# Patient Record
Sex: Female | Born: 1990 | Race: Black or African American | Hispanic: No | Marital: Single | Smoking: Never smoker
Health system: Southern US, Community
[De-identification: ages and names within clinical notes are randomized; demographics above are authoritative.]

## PROBLEM LIST (undated history)

## (undated) ENCOUNTER — Inpatient Hospital Stay (HOSPITAL_COMMUNITY): Payer: Self-pay

## (undated) ENCOUNTER — Inpatient Hospital Stay (HOSPITAL_COMMUNITY): Payer: No Typology Code available for payment source

## (undated) DIAGNOSIS — I10 Essential (primary) hypertension: Secondary | ICD-10-CM

## (undated) DIAGNOSIS — Z789 Other specified health status: Secondary | ICD-10-CM

## (undated) HISTORY — PX: INDUCED ABORTION: SHX677

## (undated) HISTORY — PX: NO PAST SURGERIES: SHX2092

---

## 2008-11-05 ENCOUNTER — Other Ambulatory Visit: Payer: Self-pay | Admitting: Emergency Medicine

## 2008-11-06 ENCOUNTER — Inpatient Hospital Stay (HOSPITAL_COMMUNITY): Admission: AD | Admit: 2008-11-06 | Discharge: 2008-11-06 | Payer: Self-pay | Admitting: Obstetrics & Gynecology

## 2008-11-06 ENCOUNTER — Ambulatory Visit: Payer: Self-pay | Admitting: Physician Assistant

## 2008-11-08 ENCOUNTER — Inpatient Hospital Stay (HOSPITAL_COMMUNITY): Admission: AD | Admit: 2008-11-08 | Discharge: 2008-11-08 | Payer: Self-pay | Admitting: Obstetrics & Gynecology

## 2008-11-12 ENCOUNTER — Inpatient Hospital Stay (HOSPITAL_COMMUNITY): Admission: AD | Admit: 2008-11-12 | Discharge: 2008-11-12 | Payer: Self-pay | Admitting: Obstetrics and Gynecology

## 2008-11-12 ENCOUNTER — Ambulatory Visit: Payer: Self-pay | Admitting: Obstetrics and Gynecology

## 2010-07-13 LAB — WET PREP, GENITAL
Trich, Wet Prep: NONE SEEN
WBC, Wet Prep HPF POC: NONE SEEN
Yeast Wet Prep HPF POC: NONE SEEN

## 2010-07-13 LAB — CBC
HCT: 39.3 % (ref 36.0–46.0)
Hemoglobin: 13.5 g/dL (ref 12.0–15.0)
RDW: 12.7 % (ref 11.5–15.5)

## 2010-07-13 LAB — URINALYSIS, ROUTINE W REFLEX MICROSCOPIC
Glucose, UA: NEGATIVE mg/dL
Hgb urine dipstick: NEGATIVE
Protein, ur: NEGATIVE mg/dL
Specific Gravity, Urine: <1.005 — ABNORMAL LOW (ref 1.005–1.030)

## 2010-07-13 LAB — HCG, QUANTITATIVE, PREGNANCY
hCG, Beta Chain, Quant, S: 411 m[IU]/mL — ABNORMAL HIGH (ref <5)
hCG, Beta Chain, Quant, S: 72 m[IU]/mL — ABNORMAL HIGH (ref <5)

## 2010-07-13 LAB — ABO/RH: ABO/RH(D): O POS

## 2011-07-14 ENCOUNTER — Emergency Department (HOSPITAL_BASED_OUTPATIENT_CLINIC_OR_DEPARTMENT_OTHER)
Admission: EM | Admit: 2011-07-14 | Discharge: 2011-07-14 | Disposition: A | Discharge status: Home / Self Care | Payer: No Typology Code available for payment source | Attending: Emergency Medicine | Admitting: Emergency Medicine

## 2011-07-14 ENCOUNTER — Encounter (HOSPITAL_BASED_OUTPATIENT_CLINIC_OR_DEPARTMENT_OTHER): Payer: Self-pay

## 2011-07-14 DIAGNOSIS — Y92410 Unspecified street and highway as the place of occurrence of the external cause: Secondary | ICD-10-CM | POA: Insufficient documentation

## 2011-07-14 DIAGNOSIS — T148XXA Other injury of unspecified body region, initial encounter: Secondary | ICD-10-CM

## 2011-07-14 DIAGNOSIS — IMO0002 Reserved for concepts with insufficient information to code with codable children: Secondary | ICD-10-CM | POA: Insufficient documentation

## 2011-07-14 MED ORDER — DIAZEPAM 5 MG PO TABS
2.5000 mg | ORAL_TABLET | Freq: Once | ORAL | Status: AC
  Administered 2011-07-14: 2.5 mg via ORAL

## 2011-07-14 MED ORDER — DIAZEPAM 2 MG PO TABS
2.0000 mg | ORAL_TABLET | Freq: Once | ORAL | Status: DC
  Filled 2011-07-14: qty 1

## 2011-07-14 MED ORDER — IBUPROFEN 200 MG PO TABS: 800.0000 mg | ORAL_TABLET | Freq: Three times a day (TID) | ORAL | Status: AC

## 2011-07-14 MED ORDER — DIAZEPAM 5 MG PO TABS: 5.0000 mg | ORAL_TABLET | Freq: Every day | ORAL | Status: AC

## 2011-07-14 MED ORDER — DIAZEPAM 5 MG PO TABS
ORAL_TABLET | ORAL | Status: AC
  Filled 2011-07-14: qty 1
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
## 2012-11-04 ENCOUNTER — Encounter (HOSPITAL_BASED_OUTPATIENT_CLINIC_OR_DEPARTMENT_OTHER): Payer: Self-pay | Admitting: *Deleted

## 2012-11-04 ENCOUNTER — Emergency Department (HOSPITAL_BASED_OUTPATIENT_CLINIC_OR_DEPARTMENT_OTHER)
Admission: EM | Admit: 2012-11-04 | Discharge: 2012-11-04 | Disposition: A | Discharge status: Home / Self Care | Payer: Self-pay | Attending: Emergency Medicine | Admitting: Emergency Medicine

## 2012-11-04 DIAGNOSIS — R197 Diarrhea, unspecified: Secondary | ICD-10-CM | POA: Insufficient documentation

## 2012-11-04 DIAGNOSIS — R112 Nausea with vomiting, unspecified: Secondary | ICD-10-CM

## 2012-11-04 DIAGNOSIS — Z3202 Encounter for pregnancy test, result negative: Secondary | ICD-10-CM | POA: Insufficient documentation

## 2012-11-04 LAB — URINALYSIS, ROUTINE W REFLEX MICROSCOPIC
Bilirubin Urine: NEGATIVE
Glucose, UA: NEGATIVE mg/dL
Ketones, ur: NEGATIVE mg/dL
Nitrite: NEGATIVE
Protein, ur: 30 mg/dL — AB
Specific Gravity, Urine: 1.016 (ref 1.005–1.030)
Urobilinogen, UA: 1 mg/dL (ref 0.0–1.0)
pH: 8.5 — ABNORMAL HIGH (ref 5.0–8.0)

## 2012-11-04 LAB — PREGNANCY, URINE: Preg Test, Ur: NEGATIVE

## 2012-11-04 LAB — URINE MICROSCOPIC-ADD ON

## 2012-11-04 MED ORDER — PROMETHAZINE HCL 25 MG PO TABS: 25.0000 mg | ORAL_TABLET | Freq: Four times a day (QID) | ORAL | Status: DC | PRN

## 2012-11-04 NOTE — ED Provider Notes (Signed)
CSN: [id number]     Arrival date & time 11/04/12  1941 History     First MD Initiated Contact with Patient 11/04/12 2001     Chief Complaint  Patient presents with  . Abdominal Pain   (Consider location/radiation/quality/duration/timing/severity/associated sxs/prior Treatment) HPI Comments: Pt also had 1 loose stool at same time she threw up.  This occurred at work and was told to come to the ED to be checked.  She is in the food industry.  LMP was around July 4th.  She did not miss her menses  Patient is a 22 y.o. female presenting with abdominal pain and vomiting. The history is provided by the patient.  Abdominal Pain This is a new problem. The current episode started less than 1 hour ago. The problem occurs constantly. The problem has been resolved. Associated symptoms include abdominal pain. Pertinent negatives include no chest pain.  Emesis Severity:  Mild Duration:  1 hour Number of daily episodes:  1 How soon after eating does vomiting occur:  1 hour Progression:  Resolved Chronicity:  New Recent urination:  Normal Relieved by:  Nothing Worsened by:  Nothing tried Associated symptoms: abdominal pain and diarrhea   Associated symptoms: no chills   Risk factors: suspect food intake   Risk factors: no alcohol use, no diabetes, not pregnant now, no prior abdominal surgery, no sick contacts and no travel to endemic areas     History reviewed. No pertinent past medical history. History reviewed. No pertinent past surgical history. History reviewed. No pertinent family history. History  Substance Use Topics  . Smoking status: Never Smoker   . Smokeless tobacco: Not on file  . Alcohol Use: No   OB History   Grav Para Term Preterm Abortions TAB SAB Ect Mult Living                 Review of Systems  Constitutional: Negative for fever, chills and appetite change.  Cardiovascular: Negative for chest pain.  Gastrointestinal: Positive for nausea, vomiting, abdominal pain and  diarrhea. Negative for blood in stool and abdominal distention.  Genitourinary: Negative for dysuria, urgency and flank pain.  Musculoskeletal: Negative for back pain.  Neurological: Negative for light-headedness.  All other systems reviewed and are negative.    Allergies  Review of patient's allergies indicates no known allergies.  Home Medications   Current Outpatient Rx  Name  Route  Sig  Dispense  Refill  . medroxyPROGESTERone (DEPO-PROVERA) 150 MG/ML injection   Intramuscular   Inject 150 mg into the muscle every 3 (three) months.         . promethazine (PHENERGAN) 25 MG tablet   Oral   Take 1 tablet (25 mg total) by mouth every 6 (six) hours as needed for nausea.   20 tablet   0    BP 124/74  Pulse 70  Temp(Src) 98.4 F (36.9 C) (Oral)  Resp 20  Ht 5' (1.524 m)  Wt 170 lb (77.111 kg)  BMI 33.2 kg/m2  SpO2 99%  LMP 10/09/2012 Physical Exam  Nursing note and vitals reviewed. Constitutional: She is oriented to person, place, and time. She appears well-developed and well-nourished. No distress.  HENT:  Head: Normocephalic and atraumatic.  Eyes: EOM are normal.  Neck: Neck supple.  Cardiovascular: Normal rate and regular rhythm.   No murmur heard. Pulmonary/Chest: Effort normal. No respiratory distress. She has no wheezes. She has no rales.  Abdominal: Soft. She exhibits no distension. There is no tenderness. There is no  rebound and no guarding.  Neurological: She is alert and oriented to person, place, and time. Coordination normal.  Skin: Skin is warm and dry. She is not diaphoretic.  Psychiatric: She has a normal mood and affect.    ED Course   Procedures (including critical care time)  Labs Reviewed  URINALYSIS, ROUTINE W REFLEX MICROSCOPIC - Abnormal; Notable for the following:    APPearance TURBID (*)    pH 8.5 (*)    Hgb urine dipstick LARGE (*)    Protein, ur 30 (*)    Leukocytes, UA TRACE (*)    All other components within normal limits   URINE MICROSCOPIC-ADD ON - Abnormal; Notable for the following:    Squamous Epithelial / LPF FEW (*)    Bacteria, UA FEW (*)    All other components within normal limits  PREGNANCY, URINE   No results found. 1. Nausea vomiting and diarrhea     MDM  Pt likely with abd pain, cramps, now relieved after emesis and diarrhea times 1. I susspect since it occurred 1 hour after eating a sub, she had acute gastroenteritis from food intake.  Pt with sof abd now, no guard or rebound, able to tolerate pos'.  HCG is neg.  UA has some blood in it, but no dysuria or frequency, no flank pain to suggest stone.  Pt may be just about on her menses is most likely explanation.  Pt is ok going home, rest, Rx for nausea and work note.    Gavin Pound. Oletta Lamas, MD 11/04/12 2105

## 2012-11-04 NOTE — ED Notes (Signed)
Missed her menses. Nausea. States she feels weak.

## 2013-04-06 NOTE — L&D Delivery Note (Signed)
Patient was C/C/+3 and pushed for 60 minutes with epidural.    At crowning, pt began to have panic attacks and was having difficulty pushing.  I offered VE for assist, explaining the risks, benefits and alternatives.  Pt desired to proceed.  VE placed with 3 contractions with one pop off.  However, pt was unable to push and I was unable to get full face delivered. I allowed her to involuntarily push with contractions for 3 contractions. The head only partially delivered and it took two more minutes for the face to deliver with her pushing some. Once the head was delivered, the neck retracted and the anterior shoulder was stuck.  Gentle downward traction did not release shoulder and this was stopped. Time was called, help was called and multiple maneuvers were tried- McRoberts, suprapubic pressure and even knee-chest. Finally in supine the pt was able to push enough for the posterior arm to be delivered.  SVD  female infant, Apgars 2,8, weight P.  Cord gas was unable to be obtained.  Neo attended delivery and examined baby- both arms were moving well and without apparent trauma. The patient had a second degree mediolateral episiotomy repaired with 2-0 vicryl R. Fundus was firm. EBL was expected amount. Placenta was delivered intact. Vagina was clear.  Baby was vigorous and doing skin to skin with mother.  Mica Ramdass A

## 2013-07-11 ENCOUNTER — Encounter (HOSPITAL_COMMUNITY): Payer: Self-pay | Admitting: Emergency Medicine

## 2013-07-11 ENCOUNTER — Emergency Department (HOSPITAL_COMMUNITY)
Admission: EM | Admit: 2013-07-11 | Discharge: 2013-07-11 | Disposition: A | Discharge status: Home / Self Care | Payer: BC Managed Care – PPO | Attending: Emergency Medicine | Admitting: Emergency Medicine

## 2013-07-11 DIAGNOSIS — O9989 Other specified diseases and conditions complicating pregnancy, childbirth and the puerperium: Secondary | ICD-10-CM | POA: Diagnosis present

## 2013-07-11 DIAGNOSIS — R52 Pain, unspecified: Secondary | ICD-10-CM | POA: Diagnosis not present

## 2013-07-11 DIAGNOSIS — O98819 Other maternal infectious and parasitic diseases complicating pregnancy, unspecified trimester: Secondary | ICD-10-CM | POA: Insufficient documentation

## 2013-07-11 DIAGNOSIS — J020 Streptococcal pharyngitis: Secondary | ICD-10-CM | POA: Diagnosis not present

## 2013-07-11 LAB — RAPID STREP SCREEN (MED CTR MEBANE ONLY): STREPTOCOCCUS, GROUP A SCREEN (DIRECT): POSITIVE — AB

## 2013-07-11 MED ORDER — CROMOLYN SODIUM 5.2 MG/ACT NA AERS: 1.0000 | INHALATION_SPRAY | Freq: Four times a day (QID) | NASAL | Status: DC

## 2013-07-11 MED ORDER — ACETAMINOPHEN 500 MG PO TABS
1000.0000 mg | ORAL_TABLET | Freq: Once | ORAL | Status: AC
  Administered 2013-07-11: 1000 mg via ORAL
  Filled 2013-07-11: qty 2

## 2013-07-11 MED ORDER — PENICILLIN V POTASSIUM 500 MG PO TABS: 500.0000 mg | ORAL_TABLET | Freq: Four times a day (QID) | ORAL | Status: DC

## 2013-07-11 NOTE — ED Provider Notes (Signed)
CSN: [id number]     Arrival date & time 07/11/13  1836 History  This chart was scribed for non-physician practitioner, Junious SilkHannah Ephrem Carrick, PA-C, working with Juliet RudeNathan R. Rubin PayorPickering, MD by Smiley HousemanFallon Davis, ED Scribe. This patient was seen in room TR08C/TR08C and the patient's care was started at 7:45 PM.  Chief Complaint  Patient presents with  . Generalized Body Aches   HPI HPI Comments: Kelly [name] is a 23 y.o. pregnant female who presents to the Emergency Department complaining of constant generalized myalgias that started this morning.  Pt has associated sore throat, HA, cough, and nausea.  She reports she is unsure if she has been running a fever, but ED temperature is 99.38F.  Pt denies vaginal bleeding, vaginal discharge, and emesis.  Pt denies taking any medications PTA.  Pt denies any sick contacts.  She states she found out she was pregnant five days ago.  She states this is her third pregnancy, but she denies having any children.    History reviewed. No pertinent past medical history. History reviewed. No pertinent past surgical history. History reviewed. No pertinent family history. History  Substance Use Topics  . Smoking status: Never Smoker   . Smokeless tobacco: Not on file  . Alcohol Use: No   OB History   Grav Para Term Preterm Abortions TAB SAB Ect Mult Living                 Review of Systems  Constitutional: Negative for fever and chills.  HENT: Positive for sore throat. Negative for congestion, ear pain, rhinorrhea and trouble swallowing.   Respiratory: Negative for cough, shortness of breath and wheezing.   Cardiovascular: Negative for chest pain.  Gastrointestinal: Positive for nausea. Negative for vomiting, abdominal pain and diarrhea.  Genitourinary: Negative for vaginal bleeding and vaginal discharge.  Musculoskeletal: Positive for myalgias. Negative for back pain.  Neurological: Positive for headaches. Negative for dizziness, weakness and numbness.   Psychiatric/Behavioral: Negative for behavioral problems and confusion.  All other systems reviewed and are negative.    Allergies  Review of patient's allergies indicates no known allergies.  Home Medications   Current Outpatient Rx  Name  Route  Sig  Dispense  Refill  . medroxyPROGESTERone (DEPO-PROVERA) 150 MG/ML injection   Intramuscular   Inject 150 mg into the muscle every 3 (three) months.         . promethazine (PHENERGAN) 25 MG tablet   Oral   Take 1 tablet (25 mg total) by mouth every 6 (six) hours as needed for nausea.   20 tablet   0    Triage Vitals: BP 123/69  Pulse 93  Temp(Src) 99.5 F (37.5 C) (Oral)  Resp 18  Wt 176 lb 2 oz (79.89 kg)  SpO2 100%  LMP 06/09/2013  Physical Exam  Nursing note and vitals reviewed. Constitutional: She is oriented to person, place, and time. She appears well-developed and well-nourished. No distress.  HENT:  Head: Normocephalic and atraumatic.  Right Ear: Tympanic membrane, external ear and ear canal normal.  Left Ear: Tympanic membrane, external ear and ear canal normal.  Nose: Nose normal. No mucosal edema or rhinorrhea.  Mouth/Throat: Uvula is midline and mucous membranes are normal. No trismus in the jaw. No uvula swelling. Posterior oropharyngeal erythema present. No oropharyngeal exudate or posterior oropharyngeal edema.  Eyes: Conjunctivae and EOM are normal. Right eye exhibits no discharge. Left eye exhibits no discharge.  Neck: Normal range of motion. No thyromegaly present.  Cardiovascular: Normal rate,  regular rhythm and normal heart sounds.  Exam reveals no gallop and no friction rub.   No murmur heard. Pulmonary/Chest: Effort normal and breath sounds normal. No stridor. No respiratory distress. She has no wheezes. She has no rales.  Abdominal: Soft. She exhibits no distension.  Musculoskeletal: Normal range of motion.  Lymphadenopathy:    She has no cervical adenopathy.  Neurological: She is alert and  oriented to person, place, and time. She has normal strength.  Skin: Skin is warm and dry. No rash noted. She is not diaphoretic. No erythema.  Psychiatric: She has a normal mood and affect. Her behavior is normal. Judgment and thought content normal.    ED Course  Procedures (including critical care time) DIAGNOSTIC STUDIES: Oxygen Saturation is 100% on RA, normal by my interpretation.    COORDINATION OF CARE: 7:51 PM-Will order rapid strep screen.  Will order Tylenol.  Patient informed of current plan of treatment and evaluation and agrees with plan.    Results for orders placed during the hospital encounter of 07/11/13  RAPID STREP SCREEN      Result Value Ref Range   Streptococcus, Group A Screen (Direct) POSITIVE (*) NEGATIVE    MDM   Final diagnoses:  Strep pharyngitis   Pt with positive strep test. Will d/c with rx for PCN.  Pt appears mildly dehydrated, discussed importance of water rehydration. Presentation non concerning for PTA or infxn spread to soft tissue. No trismus or uvula deviation. Specific return precautions discussed. Pt able to drink water in ED without difficulty with intact air way. Recommended PCP follow up.    I personally performed the services described in this documentation, which was scribed in my presence. The recorded information has been reviewed and is accurate.       Mora Bellman, PA-C 07/11/13 2038

## 2013-07-11 NOTE — Discharge Instructions (Signed)
Strep Throat  Strep throat is an infection of the throat. It is caused by a germ. Strep throat spreads from person to person by coughing, sneezing, or close contact.  HOME CARE  · Rinse your mouth (gargle) with warm salt water (1 teaspoon salt in 1 cup of water). Do this 3 to 4 times per day or as needed for comfort.  · Family members with a sore throat or fever should see a doctor.  · Make sure everyone in your house washes their hands well.  · Do not share food, drinking cups, or personal items.  · Eat soft foods until your sore throat gets better.  · Drink enough water and fluids to keep your pee (urine) clear or pale yellow.  · Rest.  · Stay home from school, daycare, or work until you have taken medicine for 24 hours.  · Only take medicine as told by your doctor.  · Take your medicine as told. Finish it even if you start to feel better.  GET HELP RIGHT AWAY IF:   · You have new problems, such as throwing up (vomiting) or bad headaches.  · You have a stiff or painful neck, chest pain, trouble breathing, or trouble swallowing.  · You have very bad throat pain, drooling, or changes in your voice.  · Your neck puffs up (swells) or gets red and tender.  · You have a fever.  · You are very tired, your mouth is dry, or you are peeing less than normal.  · You cannot wake up completely.  · You get a rash, cough, or earache.  · You have green, yellow-brown, or bloody spit.  · Your pain does not get better with medicine.  MAKE SURE YOU:   · Understand these instructions.  · Will watch your condition.  · Will get help right away if you are not doing well or get worse.  Document Released: 09/09/2007 Document Revised: 06/15/2011 Document Reviewed: 05/22/2010  ExitCare® Patient Information ©2014 [employer].

## 2013-07-11 NOTE — ED Notes (Signed)
Onset this morning upon awakening headache, body aches, sore throat.  Painful to swallow.  No swallowing or resp difficulties.  No known contact with anyone with strep.  Has not taken any meds for headache.  Pt has first OB appt 07-26-13.  LMP 06-09-13.

## 2013-07-11 NOTE — ED Notes (Signed)
Reports onset this am of generalized bodyaches, headache, sore throat. Denies any n/v/d. Pt is pregnant, lmp 3/6.

## 2013-07-12 NOTE — ED Provider Notes (Signed)
Medical screening examination/treatment/procedure(s) were performed by non-physician practitioner and as supervising physician I was immediately available for consultation/collaboration.   EKG Interpretation None       Michaelpaul Apo R. Sayid Moll, MD 07/12/13 2344 

## 2013-07-17 ENCOUNTER — Inpatient Hospital Stay (HOSPITAL_COMMUNITY): Payer: BC Managed Care – PPO

## 2013-07-17 ENCOUNTER — Encounter (HOSPITAL_COMMUNITY): Payer: Self-pay | Admitting: *Deleted

## 2013-07-17 ENCOUNTER — Inpatient Hospital Stay (HOSPITAL_COMMUNITY)
Admission: AD | Admit: 2013-07-17 | Discharge: 2013-07-17 | Disposition: A | Discharge status: Home / Self Care | Payer: BC Managed Care – PPO | Source: Ambulatory Visit | Attending: Obstetrics & Gynecology | Admitting: Obstetrics & Gynecology

## 2013-07-17 DIAGNOSIS — O209 Hemorrhage in early pregnancy, unspecified: Secondary | ICD-10-CM | POA: Diagnosis present

## 2013-07-17 DIAGNOSIS — O200 Threatened abortion: Secondary | ICD-10-CM

## 2013-07-17 DIAGNOSIS — O4690 Antepartum hemorrhage, unspecified, unspecified trimester: Secondary | ICD-10-CM

## 2013-07-17 HISTORY — DX: Other specified health status: Z78.9

## 2013-07-17 LAB — WET PREP, GENITAL
CLUE CELLS WET PREP: NONE SEEN
TRICH WET PREP: NONE SEEN
WBC, Wet Prep HPF POC: NONE SEEN
Yeast Wet Prep HPF POC: NONE SEEN

## 2013-07-17 LAB — CBC
HCT: 38.9 % (ref 36.0–46.0)
HEMOGLOBIN: 14 g/dL (ref 12.0–15.0)
MCH: 29.7 pg (ref 26.0–34.0)
MCHC: 36 g/dL (ref 30.0–36.0)
MCV: 82.4 fL (ref 78.0–100.0)
Platelets: 281 10*3/uL (ref 150–400)
RBC: 4.72 MIL/uL (ref 3.87–5.11)
RDW: 12.7 % (ref 11.5–15.5)
WBC: 4.6 10*3/uL (ref 4.0–10.5)

## 2013-07-17 LAB — URINALYSIS, ROUTINE W REFLEX MICROSCOPIC
Bilirubin Urine: NEGATIVE
Glucose, UA: NEGATIVE mg/dL
Ketones, ur: NEGATIVE mg/dL
Leukocytes, UA: NEGATIVE
Nitrite: NEGATIVE
PROTEIN: NEGATIVE mg/dL
SPECIFIC GRAVITY, URINE: 1.025 (ref 1.005–1.030)
UROBILINOGEN UA: 0.2 mg/dL (ref 0.0–1.0)
pH: 6 (ref 5.0–8.0)

## 2013-07-17 LAB — POCT PREGNANCY, URINE: Preg Test, Ur: POSITIVE — AB

## 2013-07-17 LAB — HCG, QUANTITATIVE, PREGNANCY: HCG, BETA CHAIN, QUANT, S: 2273 m[IU]/mL — AB (ref <5)

## 2013-07-17 LAB — URINE MICROSCOPIC-ADD ON

## 2013-07-17 LAB — OB RESULTS CONSOLE GC/CHLAMYDIA
CHLAMYDIA, DNA PROBE: NEGATIVE
Gonorrhea: NEGATIVE

## 2013-07-17 NOTE — MAU Provider Note (Signed)
Attestation of Attending Supervision of Advanced Practitioner (CNM/NP): Evaluation and management procedures were performed by the Advanced Practitioner under my supervision and collaboration.  I have reviewed the Advanced Practitioner's note and chart, and I agree with the management and plan.  Dallen Bunte Harraway-Smith 3:17 PM     

## 2013-07-17 NOTE — Discharge Instructions (Signed)
Pelvic Rest Pelvic rest is sometimes recommended for women when:   The placenta is partially or completely covering the opening of the cervix (placenta previa).  There is bleeding between the uterine wall and the amniotic sac in the first trimester (subchorionic hemorrhage).  The cervix begins to open without labor starting (incompetent cervix, cervical insufficiency).  The labor is too early (preterm labor). HOME CARE INSTRUCTIONS  Do not have sexual intercourse, stimulation, or an orgasm.  Do not use tampons, douche, or put anything in the vagina.  Do not lift anything over 10 pounds (4.5 kg).  Avoid strenuous activity or straining your pelvic muscles. SEEK MEDICAL CARE IF:  You have any vaginal bleeding during pregnancy. Treat this as a potential emergency.  You have cramping pain felt low in the stomach (stronger than menstrual cramps).  You notice vaginal discharge (watery, mucus, or bloody).  You have a low, dull backache.  There are regular contractions or uterine tightening. SEEK IMMEDIATE MEDICAL CARE IF: You have vaginal bleeding and have placenta previa.  Document Released: 07/18/2010 Document Revised: 06/15/2011 Document Reviewed: 07/18/2010 [facility] Patient Information 2014 [place], [place].  Threatened Miscarriage  A threatened miscarriage is a pregnancy that may end. It may be marked by bleeding during the first 20 weeks of pregnancy. Often, the pregnancy can continue without any more problems. You may be asked to stop:  Having sex (intercourse).  Having orgasms.  Using tampons.  Exercising.  Doing heavy physical activity and work. HOME CARE   Your doctor may tell you to take bed rest and to stop activities and work.  Write down the number of pads you use each day. Write down how often you change pads. Write down how soaked they are.  Follow your doctor's advice for follow-up visits and tests.  If your blood type is Rh-negative and the father's  blood is Rh-positive (or is not known), you may get a shot to protect the baby.  If you have a miscarriage, save all the tissue you pass in a container. Take the container to your doctor. GET HELP RIGHT AWAY IF:   You have bad cramps or pain in your belly (abdomen), lower belly, or back.  You have a fever or chills.  Your bleeding gets worse or you pass large clots of blood or tissue. Save this tissue to show your doctor.  You feel lightheaded, weak, dizzy, or pass out (faint).  You have a gush of fluid from your vagina. MAKE SURE YOU:   Understand these instructions.  Will watch your condition.  Will get help right away if you are not doing well or get worse. Document Released: 03/05/2008 Document Revised: 06/15/2011 Document Reviewed: 04/08/2009 [facility] Patient Information 2014 [place], [place].  Vaginal Bleeding During Pregnancy, First Trimester A small amount of bleeding (spotting) from the vagina is relatively common in early pregnancy. It usually stops on its own. Various things may cause bleeding or spotting in early pregnancy. Some bleeding may be related to the pregnancy, and some may not. In most cases, the bleeding is normal and is not a problem. However, bleeding can also be a sign of something serious. Be sure to tell your health care provider about any vaginal bleeding right away. Some possible causes of vaginal bleeding during the first trimester include:  Infection or inflammation of the cervix.  Growths (polyps) on the cervix.  Miscarriage or threatened miscarriage.  Pregnancy tissue has developed outside of the uterus and in a fallopian tube (tubal pregnancy).  Tiny  cysts have developed in the uterus instead of pregnancy tissue (molar pregnancy). HOME CARE INSTRUCTIONS  Watch your condition for any changes. The following actions may help to lessen any discomfort you are feeling:  Follow your health care provider's instructions for limiting your activity. If  your health care provider orders bed rest, you may need to stay in bed and only get up to use the bathroom. However, your health care provider may allow you to continue light activity.  If needed, make plans for someone to help with your regular activities and responsibilities while you are on bed rest.  Keep track of the number of pads you use each day, how often you change pads, and how soaked (saturated) they are. Write this down.  Do not use tampons. Do not douche.  Do not have sexual intercourse or orgasms until approved by your health care provider.  If you pass any tissue from your vagina, save the tissue so you can show it to your health care provider.  Only take over-the-counter or prescription medicines as directed by your health care provider.  Do not take aspirin because it can make you bleed.  Keep all follow-up appointments as directed by your health care provider. SEEK MEDICAL CARE IF:  You have any vaginal bleeding during any part of your pregnancy.  You have cramps or labor pains. SEEK IMMEDIATE MEDICAL CARE IF:   You have severe cramps in your back or belly (abdomen).  You have a fever, not controlled by medicine.  You pass large clots or tissue from your vagina.  Your bleeding increases.  You feel lightheaded or weak, or you have fainting episodes.  You have chills.  You are leaking fluid or have a gush of fluid from your vagina.  You pass out while having a bowel movement. MAKE SURE YOU:  Understand these instructions.  Will watch your condition.  Will get help right away if you are not doing well or get worse. Document Released: 12/31/2004 Document Revised: 01/11/2013 Document Reviewed: 11/28/2012 [facility] [place]ExitCare Patient Information 2014 [place]Care, [place].

## 2013-07-17 NOTE — MAU Note (Addendum)
Bleeding noted this morning one time when she wiped, red, small clot noted. Denies pain. + HPT the 5 or 6 of April

## 2013-07-17 NOTE — MAU Provider Note (Signed)
History     CSN: [id number]  Arrival date and time: 07/17/13 0906   First Provider Initiated Contact with Patient 07/17/13 [id number]      Chief Complaint  Patient presents with  . Possible Pregnancy  . Vaginal Bleeding   HPI   Ms. Kelly [name] is a 23 y.o. female [id number] Unknown gestation who presents following an episode of bright red bleeding that she noticed when she wiped this morning after using the bathroom. The patient had a positive home pregnancy test this week. The patient presents with a peri pad on that showed a small amount of bright red blood.  Patient denies pain.   OB History   Grav Para Term Preterm Abortions TAB SAB Ect Mult Living   3 0 0 0 2 1 1 0 0 0       No past medical history on file.  No past surgical history on file.  No family history on file.  History  Substance Use Topics  . Smoking status: Never Smoker   . Smokeless tobacco: Not on file  . Alcohol Use: No    Allergies: No Known Allergies  Prescriptions prior to admission  Medication Sig Dispense Refill  . cromolyn (NASALCROM) 5.2 MG/ACT nasal spray Place 1 spray into both nostrils 4 (four) times daily.  26 mL  12  . medroxyPROGESTERone (DEPO-PROVERA) 150 MG/ML injection Inject 150 mg into the muscle every 3 (three) months.      . penicillin v potassium (VEETID) 500 MG tablet Take 1 tablet (500 mg total) by mouth 4 (four) times daily.  40 tablet  0  . promethazine (PHENERGAN) 25 MG tablet Take 1 tablet (25 mg total) by mouth every 6 (six) hours as needed for nausea.  20 tablet  0   Results for orders placed during the hospital encounter of 07/17/13 (from the past 48 hour(s))  URINALYSIS, ROUTINE W REFLEX MICROSCOPIC     Status: Abnormal   Collection Time    07/17/13  9:20 AM      Result Value Ref Range   Color, Urine YELLOW  YELLOW   APPearance CLEAR  CLEAR   Specific Gravity, Urine 1.025  1.005 - 1.030   pH 6.0  5.0 - 8.0   Glucose, UA NEGATIVE  NEGATIVE mg/dL   Hgb  urine dipstick LARGE (*) NEGATIVE   Bilirubin Urine NEGATIVE  NEGATIVE   Ketones, ur NEGATIVE  NEGATIVE mg/dL   Protein, ur NEGATIVE  NEGATIVE mg/dL   Urobilinogen, UA 0.2  0.0 - 1.0 mg/dL   Nitrite NEGATIVE  NEGATIVE   Leukocytes, UA NEGATIVE  NEGATIVE  URINE MICROSCOPIC-ADD ON     Status: Abnormal   Collection Time    07/17/13  9:20 AM      Result Value Ref Range   Squamous Epithelial / LPF FEW (*) RARE   WBC, UA 0-2  <3 WBC/hpf   RBC / HPF 0-2  <3 RBC/hpf  POCT PREGNANCY, URINE     Status: Abnormal   Collection Time    07/17/13  9:38 AM      Result Value Ref Range   Preg Test, Ur POSITIVE (*) NEGATIVE   Comment:            THE SENSITIVITY OF THIS     METHODOLOGY IS >24 mIU/mL  WET PREP, GENITAL     Status: None   Collection Time    07/17/13  9:48 AM      Result Value Ref Range   Yeast  Wet Prep HPF POC NONE SEEN  NONE SEEN   Trich, Wet Prep NONE SEEN  NONE SEEN   Clue Cells Wet Prep HPF POC NONE SEEN  NONE SEEN   WBC, Wet Prep HPF POC NONE SEEN  NONE SEEN   Comment: MODERATE BACTERIA SEEN  CBC     Status: None   Collection Time    07/17/13  9:49 AM      Result Value Ref Range   WBC 4.6  4.0 - 10.5 K/uL   RBC 4.72  3.87 - 5.11 MIL/uL   Hemoglobin 14.0  12.0 - 15.0 g/dL   HCT 96.038.9  45.436.0 - 09.846.0 %   MCV 82.4  78.0 - 100.0 fL   MCH 29.7  26.0 - 34.0 pg   MCHC 36.0  30.0 - 36.0 g/dL   RDW 11.912.7  14.711.5 - 82.915.5 %   Platelets 281  150 - 400 K/uL  HCG, QUANTITATIVE, PREGNANCY     Status: Abnormal   Collection Time    07/17/13  9:49 AM      Result Value Ref Range   hCG, Beta Chain, Quant, S 2273 (*) <5 mIU/mL   Comment:              GEST. AGE      CONC.  (mIU/mL)       <=1 WEEK        5 - 50         2 WEEKS       50 - 500         3 WEEKS       100 - 10,000         4 WEEKS     1,000 - 30,000         5 WEEKS     3,500 - 115,000       6-8 WEEKS     12,000 - 270,000        12 WEEKS     15,000 - 220,000                FEMALE AND NON-PREGNANT FEMALE:         LESS THAN 5 mIU/mL    [place] Ob Comp Less 14 Wks  07/17/2013   CLINICAL DATA:  23 year old female with bleeding in early pregnancy. Estimated gestational age by LMP 5 weeks 3 days. Initial encounter.  EXAM: OBSTETRIC <14 WK US AND TRANSVAGINAL OB US  TECHNIQUE: Both transabdominal and transvaginal ultrasound examinations were performed for complete evaluation of the gestation as well as the maternal uterus, adnexal regions, and pelvic cul-de-sac. Transvaginal technique was performed to assess early pregnancy.  COMPARISON:  None relevant.  FINDINGS: Intrauterine gestational sac: Single  Yolk sac:  Not visible  Embryo:  Not visible  Cardiac Activity: Not detected  MSD:  4.1  mm   for w   6  d  Maternal uterus/adnexae: Small volume subchorionic hemorrhage suspected (image 57). Both ovaries appear normal with small follicles. Trace simple appearing pelvic free fluid.  IMPRESSION: 1. Probable early intrauterine gestational sac, but no yolk sac, fetal pole, or cardiac activity yet visualized. Recommend follow-up quantitative B-HCG levels and follow-up US in 14 days to confirm and assess viability. This recommendation follows SRU consensus guidelines: Diagnostic Criteria for Nonviable Pregnancy Early in the First Trimester. Malva Limes Engl J Med 2013; 562:1308-65; 369:1443-51. 2. Small volume subchorionic hemorrhage. Trace simple appearing pelvic free fluid.   Electronically Signed   By: Augusto GambleLee  Hall  M.D.   On: 07/17/2013 10:48   [place] Ob Transvaginal  07/17/2013   CLINICAL DATA:  23 year old female with bleeding in early pregnancy. Estimated gestational age by LMP 5 weeks 3 days. Initial encounter.  EXAM: OBSTETRIC <14 WK US AND TRANSVAGINAL OB US  TECHNIQUE: Both transabdominal and transvaginal ultrasound examinations were performed for complete evaluation of the gestation as well as the maternal uterus, adnexal regions, and pelvic cul-de-sac. Transvaginal technique was performed to assess early pregnancy.  COMPARISON:  None relevant.  FINDINGS: Intrauterine  gestational sac: Single  Yolk sac:  Not visible  Embryo:  Not visible  Cardiac Activity: Not detected  MSD:  4.1  mm   for w   6  d  Maternal uterus/adnexae: Small volume subchorionic hemorrhage suspected (image 57). Both ovaries appear normal with small follicles. Trace simple appearing pelvic free fluid.  IMPRESSION: 1. Probable early intrauterine gestational sac, but no yolk sac, fetal pole, or cardiac activity yet visualized. Recommend follow-up quantitative B-HCG levels and follow-up US in 14 days to confirm and assess viability. This recommendation follows SRU consensus guidelines: Diagnostic Criteria for Nonviable Pregnancy Early in the First Trimester. Malva Limes Engl J Med 2013; 409:8119-14; 369:1443-51. 2. Small volume subchorionic hemorrhage. Trace simple appearing pelvic free fluid.   Electronically Signed   By: Augusto GambleLee  Hall M.D.   On: 07/17/2013 10:48    Review of Systems  Constitutional: Negative for fever and chills.  Gastrointestinal: Negative for nausea, vomiting and abdominal pain.  Genitourinary: Negative for dysuria, urgency, frequency and hematuria.       No vaginal discharge. Scant vaginal bleeding. No dysuria.    Physical Exam   Blood pressure 118/79, pulse 85, temperature 98.9 F (37.2 C), temperature source Oral, resp. rate 16, height 4' 11.25" (1.505 m), weight 78.577 kg (173 lb 3.7 oz), last menstrual period 06/09/2013, SpO2 99.00%.  Physical Exam  Constitutional: She is oriented to person, place, and time. She appears well-developed and well-nourished. No distress.  HENT:  Head: Normocephalic.  Eyes: Pupils are equal, round, and reactive to light.  Neck: Neck supple.  GI: Soft. She exhibits no distension. There is no tenderness. There is no rebound and no guarding.  Genitourinary:  Speculum exam: Vagina - Small amount of creamy discharge, streaky red, mucus like blood- scant amount,  no odor Cervix - No contact bleeding, no active bleeding  Bimanual exam: Cervix closed, No CMT   Uterus non tender, normal size; enlarged  Adnexa non tender, no masses bilaterally GC/Chlam, wet prep done Chaperone present for exam.   Musculoskeletal: Normal range of motion.  Neurological: She is alert and oriented to person, place, and time.  Skin: Skin is warm. She is not diaphoretic.  Psychiatric: Her behavior is normal.    MAU Course  Procedures None  MDM Wet prep GC US CBC  Spoke to Dr. Margo AyeHall (radiology) pt is 317w6d by US; addendum requested  O positive blood type   Assessment and Plan   A:  Probable IUP; no yolk sac 117w6d Vaginal bleeding in early pregnancy, cannot exclude ectopic pregnancy  Threatened miscarriage  P:  Discharge home in stable condition Return in 48 hours- clinic for repeat beta hcg level.  Ectopic precautions discussed Return to MAU with worsening pain or bleeding  Bleeding precautions discussed   Iona HansenJennifer Irene Taylor Levick 07/17/2013, 2:28 PM

## 2013-07-17 NOTE — MAU Note (Signed)
Patient states she had a positive home pregnancy test about 2 weeks ago. States she woke up this am with bright red blood on the tissue. No active bleeding after wiping. Denies pain.

## 2013-07-18 LAB — GC/CHLAMYDIA PROBE AMP
CT Probe RNA: NEGATIVE
GC Probe RNA: NEGATIVE

## 2013-07-19 ENCOUNTER — Other Ambulatory Visit: Payer: BC Managed Care – PPO

## 2013-07-19 ENCOUNTER — Telehealth: Payer: Self-pay

## 2013-07-19 DIAGNOSIS — O3680X0 Pregnancy with inconclusive fetal viability, not applicable or unspecified: Secondary | ICD-10-CM

## 2013-07-19 DIAGNOSIS — E349 Endocrine disorder, unspecified: Secondary | ICD-10-CM

## 2013-07-19 LAB — HCG, QUANTITATIVE, PREGNANCY: hCG, Beta Chain, Quant, S: 5101.3 m[IU]/mL

## 2013-07-19 NOTE — Telephone Encounter (Signed)
Stat beta quant called in result: 5101.3. Chart reviewed by Dr. Erin FullingHarraway-Smith who reccomends ultrasound for viability within the next week. Called pt. Who denies any pain, bleeding or discomfort. States she is feeling fine. Informed pt. Of results and of ultrasound for 07/25/13 at 0845. Informed pt. That she should return to MAU for any severe pain or bleeding. Pt. Verbalized understanding. NO questions or concerns.

## 2013-07-25 ENCOUNTER — Ambulatory Visit (HOSPITAL_COMMUNITY)
Admission: RE | Admit: 2013-07-25 | Discharge: 2013-07-25 | Disposition: A | Discharge status: Home / Self Care | Payer: BC Managed Care – PPO | Source: Ambulatory Visit | Attending: Obstetrics & Gynecology | Admitting: Obstetrics & Gynecology

## 2013-07-25 DIAGNOSIS — O3680X0 Pregnancy with inconclusive fetal viability, not applicable or unspecified: Secondary | ICD-10-CM | POA: Diagnosis not present

## 2013-07-25 DIAGNOSIS — Z3689 Encounter for other specified antenatal screening: Secondary | ICD-10-CM | POA: Diagnosis present

## 2013-08-30 ENCOUNTER — Inpatient Hospital Stay (HOSPITAL_COMMUNITY)
Admission: AD | Admit: 2013-08-30 | Discharge: 2013-08-30 | Disposition: A | Discharge status: Home / Self Care | Payer: BC Managed Care – PPO | Source: Ambulatory Visit | Attending: Obstetrics and Gynecology | Admitting: Obstetrics and Gynecology

## 2013-08-30 ENCOUNTER — Encounter (HOSPITAL_COMMUNITY): Payer: Self-pay | Admitting: *Deleted

## 2013-08-30 DIAGNOSIS — O219 Vomiting of pregnancy, unspecified: Secondary | ICD-10-CM

## 2013-08-30 DIAGNOSIS — E860 Dehydration: Secondary | ICD-10-CM | POA: Diagnosis not present

## 2013-08-30 DIAGNOSIS — O211 Hyperemesis gravidarum with metabolic disturbance: Secondary | ICD-10-CM | POA: Diagnosis not present

## 2013-08-30 DIAGNOSIS — O210 Mild hyperemesis gravidarum: Secondary | ICD-10-CM | POA: Diagnosis present

## 2013-08-30 LAB — URINALYSIS, ROUTINE W REFLEX MICROSCOPIC
BILIRUBIN URINE: NEGATIVE
Glucose, UA: NEGATIVE mg/dL
Hgb urine dipstick: NEGATIVE
LEUKOCYTES UA: NEGATIVE
Nitrite: NEGATIVE
PH: 6 (ref 5.0–8.0)
Protein, ur: NEGATIVE mg/dL
Urobilinogen, UA: 0.2 mg/dL (ref 0.0–1.0)

## 2013-08-30 LAB — OB RESULTS CONSOLE RPR: RPR: NONREACTIVE

## 2013-08-30 LAB — OB RESULTS CONSOLE ABO/RH: RH TYPE: POSITIVE

## 2013-08-30 LAB — OB RESULTS CONSOLE HIV ANTIBODY (ROUTINE TESTING): HIV: NONREACTIVE

## 2013-08-30 LAB — OB RESULTS CONSOLE ANTIBODY SCREEN: ANTIBODY SCREEN: NEGATIVE

## 2013-08-30 LAB — OB RESULTS CONSOLE HEPATITIS B SURFACE ANTIGEN: HEP B S AG: NEGATIVE

## 2013-08-30 LAB — OB RESULTS CONSOLE RUBELLA ANTIBODY, IGM: Rubella: IMMUNE

## 2013-08-30 MED ORDER — SODIUM CHLORIDE 0.9 % IV SOLN
25.0000 mg | Freq: Once | INTRAVENOUS | Status: AC
  Administered 2013-08-30: 25 mg via INTRAVENOUS
  Filled 2013-08-30: qty 1

## 2013-08-30 MED ORDER — ONDANSETRON 4 MG PO TBDP: 4.0000 mg | ORAL_TABLET | Freq: Four times a day (QID) | ORAL | Status: DC | PRN

## 2013-08-30 NOTE — MAU Note (Signed)
Patient states she has had vomiting with this pregnancy for a while. Was every other day but has been daily for about 2 weeks. Not able to keep anything down. Was seen at the office today and sent to MAU for hydration. Denies bleeding or discharge. States she has occasional cramping but not today.

## 2013-08-30 NOTE — MAU Note (Signed)
PO SPRITE 

## 2013-08-30 NOTE — Discharge Instructions (Signed)
Morning Sickness °Morning sickness is when you feel sick to your stomach (nauseous) during pregnancy. This nauseous feeling may or may not come with vomiting. It often occurs in the morning but can be a problem any time of day. Morning sickness is most common during the first trimester, but it may continue throughout pregnancy. While morning sickness is unpleasant, it is usually harmless unless you develop severe and continual vomiting (hyperemesis gravidarum). This condition requires more intense treatment.  °CAUSES  °The cause of morning sickness is not completely known but seems to be related to normal hormonal changes that occur in pregnancy. °RISK FACTORS °You are at greater risk if you: °· Experienced nausea or vomiting before your pregnancy. °· Had morning sickness during a previous pregnancy. °· Are pregnant with more than one baby, such as twins. °TREATMENT  °Do not use any medicines (prescription, over-the-counter, or herbal) for morning sickness without first talking to your health care provider. Your health care provider may prescribe or recommend: °· Vitamin B6 supplements. °· Anti-nausea medicines. °· The herbal medicine ginger. °HOME CARE INSTRUCTIONS  °· Only take over-the-counter or prescription medicines as directed by your health care provider. °· Taking multivitamins before getting pregnant can prevent or decrease the severity of morning sickness in most women.   °· Eat a piece of dry toast or unsalted crackers before getting out of bed in the morning.   °· Eat five or six small meals a day.   °· Eat dry and bland foods (rice, baked potato ). Foods high in carbohydrates are often helpful.  °· Do not drink liquids with your meals. Drink liquids between meals.   °· Avoid greasy, fatty, and spicy foods.   °· Get someone to cook for you if the smell of any food causes nausea and vomiting.   °· If you feel nauseous after taking prenatal vitamins, take the vitamins at night or with a snack.  °· Snack  on protein foods (nuts, yogurt, cheese) between meals if you are hungry.   °· Eat unsweetened gelatins for desserts.   °· Wearing an acupressure wristband (worn for sea sickness) may be helpful.   °· Acupuncture may be helpful.   °· Do not smoke.   °· Get a humidifier to keep the air in your house free of odors.   °· Get plenty of fresh air. °SEEK MEDICAL CARE IF:  °· Your home remedies are not working, and you need medicine. °· You feel dizzy or lightheaded. °· You are losing weight. °SEEK IMMEDIATE MEDICAL CARE IF:  °· You have persistent and uncontrolled nausea and vomiting. °· You pass out (faint). °Document Released: 05/14/2006 Document Revised: 11/23/2012 Document Reviewed: 09/07/2012 °ExitCare® Patient Information ©2014 [employer]. ° °

## 2013-08-30 NOTE — MAU Provider Note (Signed)
History     CSN: [id number]  Arrival date and time: 08/30/13 1108   None     Chief Complaint  Patient presents with  . Emesis During Pregnancy   HPI This is a 23 y.o. at [redacted]w[redacted]d who presents with c/o nausea and vomiting, worse over past week. Unable to keep anything down. Has not tried any meds at home  Per Dr Henderson Cloud, patient has already had cultures done, has Rx for Phenergan, has work note.  Just needs IVF and Phenergan.   RN Note:  PT SAYS SHE WENT TO DR THIS AM- COLLECTED URINE- TOLD HER TO COME HERE. LAST VOMITED - THIS AM- .TODAY THEY GAVE HER RX-PHENERGAN . HAS NOT HAD ANY OTHER MEDS FOR N/V BEFORE TODAY.       OB History   Grav Para Term Preterm Abortions TAB SAB Ect Mult Living   3 0 0 0 2 1 1 0 0 0       Past Medical History  Diagnosis Date  . Medical history non-contributory     Past Surgical History  Procedure Laterality Date  . Induced abortion    . No past surgeries      Family History  Problem Relation Age of Onset  . Cancer Paternal Grandmother     grandma  . Hearing loss Neg Hx     History  Substance Use Topics  . Smoking status: Never Smoker   . Smokeless tobacco: Not on file  . Alcohol Use: No    Allergies: No Known Allergies  No prescriptions prior to admission    Review of Systems  Constitutional: Positive for malaise/fatigue. Negative for fever and chills.  Gastrointestinal: Positive for nausea and vomiting. Negative for abdominal pain, diarrhea and constipation.  Genitourinary: Negative for dysuria.  Musculoskeletal: Negative for myalgias.  Neurological: Positive for dizziness and weakness.   Physical Exam   Blood pressure 121/74, pulse 100, temperature 98.9 F (37.2 C), temperature source Oral, resp. rate 16, height 5' 0.75" (1.543 m), weight 72.303 kg (159 lb 6.4 oz), last menstrual period 06/09/2013, SpO2 99.00%.  Physical Exam  Constitutional: She is oriented to person, place, and time. She appears well-developed. No  distress.  HENT:  Head: Normocephalic.  Cardiovascular: Normal rate.  Exam reveals no gallop and no friction rub.   No murmur heard. Respiratory: Effort normal.  GI: Soft. There is no tenderness. There is no rebound and no guarding.  Musculoskeletal: Normal range of motion.  Neurological: She is alert and oriented to person, place, and time.  Skin: Skin is warm and dry.  Psychiatric: She has a normal mood and affect.   Results for orders placed during the hospital encounter of 08/30/13 (from the past 72 hour(s))  URINALYSIS, ROUTINE W REFLEX MICROSCOPIC     Status: Abnormal   Collection Time    08/30/13 11:25 AM      Result Value Ref Range   Color, Urine YELLOW  YELLOW   APPearance CLEAR  CLEAR   Specific Gravity, Urine >1.030 (*) 1.005 - 1.030   pH 6.0  5.0 - 8.0   Glucose, UA NEGATIVE  NEGATIVE mg/dL   Hgb urine dipstick NEGATIVE  NEGATIVE   Bilirubin Urine NEGATIVE  NEGATIVE   Ketones, ur >80 (*) NEGATIVE mg/dL   Protein, ur NEGATIVE  NEGATIVE mg/dL   Urobilinogen, UA 0.2  0.0 - 1.0 mg/dL   Nitrite NEGATIVE  NEGATIVE   Leukocytes, UA NEGATIVE  NEGATIVE   Comment: MICROSCOPIC NOT DONE ON URINES WITH NEGATIVE  PROTEIN, BLOOD, LEUKOCYTES, NITRITE, OR GLUCOSE <1000 mg/dL.    MAU Course  Procedures  MDM Hydrated with IV fluids, Phenergan with good relief after 2 liters. Able to tolerate Sprite.   Assessment and Plan  A:  SIUP at. 4236w6d      Nausea and vomiting of pregnancy with dehydration  P:  Discharge home      Has Rx Phenergan      Advance diet as tolerated      Followup in office  Kelly [name] 08/30/2013, 11:57 AM

## 2013-08-30 NOTE — MAU Note (Signed)
PT SAYS SHE WENT  TO DR  THIS AM-   COLLECTED URINE-  TOLD  HER TO COME HERE. LAST VOMITED -  THIS AM-  .TODAY THEY GAVE HER RX-PHENERGAN    .  HAS NOT HAD  ANY OTHER MEDS FOR N/V  BEFORE TODAY.

## 2013-11-22 ENCOUNTER — Encounter (HOSPITAL_COMMUNITY): Payer: Self-pay | Admitting: Emergency Medicine

## 2013-11-22 ENCOUNTER — Emergency Department (HOSPITAL_COMMUNITY)
Admission: EM | Admit: 2013-11-22 | Discharge: 2013-11-22 | Disposition: A | Discharge status: Home / Self Care | Payer: BC Managed Care – PPO | Attending: Emergency Medicine | Admitting: Emergency Medicine

## 2013-11-22 DIAGNOSIS — M545 Low back pain, unspecified: Secondary | ICD-10-CM | POA: Insufficient documentation

## 2013-11-22 DIAGNOSIS — Z3490 Encounter for supervision of normal pregnancy, unspecified, unspecified trimester: Secondary | ICD-10-CM

## 2013-11-22 DIAGNOSIS — O9989 Other specified diseases and conditions complicating pregnancy, childbirth and the puerperium: Secondary | ICD-10-CM | POA: Insufficient documentation

## 2013-11-22 DIAGNOSIS — J069 Acute upper respiratory infection, unspecified: Secondary | ICD-10-CM | POA: Diagnosis not present

## 2013-11-22 DIAGNOSIS — H579 Unspecified disorder of eye and adnexa: Secondary | ICD-10-CM | POA: Diagnosis not present

## 2013-11-22 MED ORDER — GUAIFENESIN 100 MG/5ML PO SOLN
5.0000 mL | Freq: Once | ORAL | Status: AC
  Administered 2013-11-22: 100 mg via ORAL
  Filled 2013-11-22: qty 5

## 2013-11-22 MED ORDER — ACETAMINOPHEN 325 MG PO TABS
650.0000 mg | ORAL_TABLET | Freq: Once | ORAL | Status: AC
  Administered 2013-11-22: 650 mg via ORAL
  Filled 2013-11-22: qty 2

## 2013-11-22 MED ORDER — GUAIFENESIN 100 MG/5ML PO LIQD: 100.0000 mg | ORAL | Status: DC | PRN

## 2013-11-22 MED ORDER — ACETAMINOPHEN 500 MG PO TABS: 500.0000 mg | ORAL_TABLET | Freq: Four times a day (QID) | ORAL | Status: DC | PRN

## 2013-11-22 NOTE — ED Provider Notes (Signed)
CSN: [id number]     Arrival date & time 11/22/13  1848 History   First MD Initiated Contact with Patient 11/22/13 2141     Chief Complaint  Patient presents with  . Back Pain  . URI     (Consider location/radiation/quality/duration/timing/severity/associated sxs/prior Treatment) HPI  Patient to the ED requesting evaluation for low back pain and URI symptoms. She is [redacted] weeks pregnant  She reports being on her feet for 12 hours a day for 3 days a week and having associated low back pain. No abdominal pain, vaginal discharge or vaginal bleeding. She has felt the baby move frequently today.   She has had sneezing, coughing, itchy eyes and scratchy throat. She denies having fevers, chest pains, nausea, vomiting, diarrhea or generalized weakness.  She reports not trying any treatment at home because she was not sure of what she can take. The babies father would also like to know if we can do a 3-D ultrasound  Past Medical History  Diagnosis Date  . Medical history non-contributory    Past Surgical History  Procedure Laterality Date  . Induced abortion    . No past surgeries     Family History  Problem Relation Age of Onset  . Cancer Paternal Grandmother     grandma  . Hearing loss Neg Hx    History  Substance Use Topics  . Smoking status: Never Smoker   . Smokeless tobacco: Not on file  . Alcohol Use: No   OB History   Grav Para Term Preterm Abortions TAB SAB Ect Mult Living   3 0 0 0 2 1 1 0 0 0      Review of Systems   Review of Systems  Gen: no weight loss, fevers, chills, night sweats  Eyes: no occular draining, occular pain,  No visual changes + itchy eyes Nose: no epistaxis or rhinorrhea + runny nose Mouth: no dental pain, no sore throat  Neck: no neck pain  Lungs: No hemoptysis. No wheezing + coughing CV:  No palpitations, dependent edema or orthopnea. No chest pain Abd: no diarrhea. No nausea or vomiting, No abdominal pain  GU: no dysuria or gross hematuria   MSK:  No muscle weakness, No muscular pain Neuro: no headache, no focal neurologic deficits  Skin: no rash , no wounds Psyche: no complaints of depression or anxiety     Allergies  Review of patient's allergies indicates no known allergies.  Home Medications   Prior to Admission medications   Medication Sig Start Date End Date Taking? Authorizing Provider  acetaminophen (TYLENOL) 500 MG tablet Take 1 tablet (500 mg total) by mouth every 6 (six) hours as needed. 11/22/13   Juelle Dickmann Irine SealG Atreus Hasz, PA-C  guaiFENesin (ROBITUSSIN) 100 MG/5ML liquid Take 5-10 mLs (100-200 mg total) by mouth every 4 (four) hours as needed for cough. 11/22/13   Daylan Juhnke Irine SealG Aiyah Scarpelli, PA-C   BP 116/73  Pulse 91  Temp(Src) 98.4 F (36.9 C) (Oral)  Resp 19  SpO2 99%  LMP 06/09/2013 Physical Exam  Nursing note and vitals reviewed. Constitutional: She appears well-developed and well-nourished. No distress.  HENT:  Head: Normocephalic and atraumatic.  Right Ear: External ear normal.  Left Ear: External ear normal.  Nose: Rhinorrhea present.  Mouth/Throat: Oropharynx is clear and moist. No oropharyngeal exudate.  Eyes: Pupils are equal, round, and reactive to light.  Neck: Normal range of motion. Neck supple.  Cardiovascular: Normal rate and regular rhythm.   Pulmonary/Chest: Effort normal and breath sounds normal.  She has no decreased breath sounds. She has no wheezes. She has no rhonchi. She has no rales.  Abdominal: Soft.  Genitourinary: Uterus is enlarged (gravid).  Neurological: She is alert.  Skin: Skin is warm and dry.    ED Course  Procedures (including critical care time) Labs Review Labs Reviewed - No data to display  Imaging Review No results found.   EKG Interpretation None      MDM   Final diagnoses:  Pregnant  URI (upper respiratory infection)  Bilateral low back pain without sciatica    Tylenol and Guifen given in ED. FHT are 143, baby does not appear to be in any distress. She  may have allergies or be developing a URI. No fevers, weakness, tachycardia. Normal BP, pt can follow-up as outpatient with her OB.  23 y.o.Kenzi Jhunqetta Hayes-Andrews's evaluation in the Emergency Department is complete. It has been determined that no acute conditions requiring further emergency intervention are present at this time. The patient/guardian have been advised of the diagnosis and plan. We have discussed signs and symptoms that warrant return to the ED, such as changes or worsening in symptoms.  Vital signs are stable at discharge. Filed Vitals:   11/22/13 2200  BP: 116/73  Pulse: 91  Temp:   Resp: 19    Patient/guardian has voiced understanding and agreed to follow-up with the PCP or specialist.     Dorthula Matas, PA-C 11/22/13 2225

## 2013-11-22 NOTE — Discharge Instructions (Signed)
Medicines During Pregnancy During pregnancy, there are medicines that are either safe or unsafe to take. Medicines include prescriptions from your caregiver, over-the-counter medicines, topical creams applied to the skin, and all herbal substances. Medicines are put into either Class A, B, C, or D. Class A and B medicines have been shown to be safe in pregnancy. Class C medicines are also considered to be safe in pregnancy, but these medicines should only be used when necessary. Class D medicines should not be used at all in pregnancy. They can be harmful to a baby.  It is best to take as little medicine as possible while pregnant. However, some medicines are necessary to take for the mother and baby's health. Sometimes, it is more dangerous to stop taking certain medicines than to stay on them. This is often the case for people with long-term (chronic) conditions such as asthma, diabetes, or high blood pressure (hypertension). If you are pregnant and have a chronic illness, call your caregiver right away. Bring a list of your medicines and their doses to your appointments. If you are planning to become pregnant, schedule a doctor's appointment and discuss your medicines with your caregiver. Lastly, write down the phone number to your pharmacist. They can answer questions regarding a medicine's class and safety. They cannot give advice as to whether you should or should not be on a medicine.  SAFE AND UNSAFE MEDICINES There is a long list of medicines that are considered safe in pregnancy. Below is a shorter list. For specific medicines, ask your caregiver.  AllergyMedicines Loratadine, cetirizine, and chlorpheniramine are safe to take. Certain nasal steroid sprays are safe. Talk to your caregiver about specific brands that are safe. Analgesics Acetaminophen and acetaminophen with codeine are safe to take. All other nonsteroidal anti-inflammatory drugs (NSAIDS) are not safe. This includes ibuprofen.    Antacids Many over-the-counter antacids are safe to take. Talk to your caregiver about specific brands that are safe. Famotidine, ranitidine, and lansoprazole are safe. Omepresole is considered safe to take in the second trimester. Antibiotic Medicines There are several antibiotics to avoid. These include, but are not limited to, tetracyline, quinolones, and sulfa medications. Talk to your caregiver before taking any antibiotic.  Antihistamines Talk to your caregiver about specific brands that are safe.  Asthma Medicines Most asthma steroid inhalers are safe to take. Talk to your caregiver for specific details. Calcium Calcium supplements are safe to take. Do not take oyster shell calcium.  Cough and Cold Medicines It is safe to take products with guaifenesin or dextromethorphan. Talk to your caregiver about specific brands that are safe. It is not safe to take products that contain aspirin or ibuprofen. Decongestant Medicines Pseudoephedrine-containing products are safe to take in the second and third trimester.  Depression Medicines Talk about these medicines with your caregiver.  Antidiarrheal Medicines It is safe to take loperamide. Talk to your caregiver about specific brands that are safe. It is not safe to take any antidiarrheal medicine that contains bismuth. Eyedrops Allergy eyedrops should be limited.  Iron It is safe to use certain iron-containing medicines for anemia in pregnancy. They require a prescription.  Antinausea Medicines It is safe to take doxylamine and vitamin B6 as directed. There are other prescription medicines available, if needed.  Sleep aids It is safe to take diphenhydramine and acetaminophen with diphenhydramine.  Steroids Hydrocortisone creams are safe to use as directed. Oral steroids require a prescription. It is not safe to take any hemorrhoid cream with pramoxine or phenylephrine.  Stool softener It is safe to take stool softener  medicines. Avoid daily or prolonged use of stool softeners. Thyroid Medicine It is important to stay on this thyroid medicine. It needs to be followed by your caregiver.  Vaginal Medicines Your caregiver will prescribe a medicine to you if you have a vaginal infection. Certain antifungal medicines are safe to use if you have a sexually transmitted infection (STI). Talk to your caregiver.  Document Released: 03/23/2005 Document Revised: 06/15/2011 Document Reviewed: 03/24/2011 [facility] Patient Information 2015 [place], [place]. This information is not intended to replace advice given to you by your health care provider. Make sure you discuss any questions you have with your health care provider.  Back Pain in Pregnancy Back pain during pregnancy is common. It happens in about half of all pregnancies. It is important for you and your baby that you remain active during your pregnancy.If you feel that back pain is not allowing you to remain active or sleep well, it is time to see your caregiver. Back pain may be caused by several factors related to changes during your pregnancy.Fortunately, unless you had trouble with your back before your pregnancy, the pain is likely to get better after you deliver. Low back pain usually occurs between the fifth and seventh months of pregnancy. It can, however, happen in the first couple months. Factors that increase the risk of back problems include:   Previous back problems.  Injury to your back.  Having twins or multiple births.  A chronic cough.  Stress.  Job-related repetitive motions.  Muscle or spinal disease in the back.  Family history of back problems, ruptured (herniated) discs, or osteoporosis.  Depression, anxiety, and panic attacks. CAUSES   When you are pregnant, your body produces a hormone called relaxin. This hormonemakes the ligaments connecting the low back and pubic bones more flexible. This flexibility allows the baby to be  delivered more easily. When your ligaments are loose, your muscles need to work harder to support your back. Soreness in your back can come from tired muscles. Soreness can also come from back tissues that are irritated since they are receiving less support.  As the baby grows, it puts pressure on the nerves and blood vessels in your pelvis. This can cause back pain.  As the baby grows and gets heavier during pregnancy, the uterus pushes the stomach muscles forward and changes your center of gravity. This makes your back muscles work harder to maintain good posture. SYMPTOMS  Lumbar pain during pregnancy Lumbar pain during pregnancy usually occurs at or above the waist in the center of the back. There may be pain and numbness that radiates into your leg or foot. This is similar to low back pain experienced by non-pregnant women. It usually increases with sitting for long periods of time, standing, or repetitive lifting. Tenderness may also be present in the muscles along your upper back. Posterior pelvic pain during pregnancy Pain in the back of the pelvis is more common than lumbar pain in pregnancy. It is a deep pain felt in your side at the waistline, or across the tailbone (sacrum), or in both places. You may have pain on one or both sides. This pain can also go into the buttocks and backs of the upper thighs. Pubic and groin pain may also be present. The pain does not quickly resolve with rest, and morning stiffness may also be present. Pelvic pain during pregnancy can be brought on by most activities. A high level of  fitness before and during pregnancy may or may not prevent this problem. Labor pain is usually 1 to 2 minutes apart, lasts for about 1 minute, and involves a bearing down feeling or pressure in your pelvis. However, if you are at term with the pregnancy, constant low back pain can be the beginning of early labor, and you should be aware of this. DIAGNOSIS  X-rays of the back should not  be done during the first 12 to 14 weeks of the pregnancy and only when absolutely necessary during the rest of the pregnancy. MRIs do not give off radiation and are safe during pregnancy. MRIs also should only be done when absolutely necessary. HOME CARE INSTRUCTIONS  Exercise as directed by your caregiver. Exercise is the most effective way to prevent or manage back pain. If you have a back problem, it is especially important to avoid sports that require sudden body movements. Swimming and walking are great activities.  Do not stand in one place for long periods of time.  Do not wear high heels.  Sit in chairs with good posture. Use a pillow on your lower back if necessary. Make sure your head rests over your shoulders and is not hanging forward.  Try sleeping on your side, preferably the left side, with a pillow or two between your legs. If you are sore after a night's rest, your bedmay betoo soft.Try placing a board between your mattress and box spring.  Listen to your body when lifting.If you are experiencing pain, ask for help or try bending yourknees more so you can use your leg muscles rather than your back muscles. Squat down when picking up something from the floor. Do not bend over.  Eat a healthy diet. Try to gain weight within your caregiver's recommendations.  Use heat or cold packs 3 to 4 times a day for 15 minutes to help with the pain.  Only take over-the-counter or prescription medicines for pain, discomfort, or fever as directed by your caregiver. Sudden (acute) back pain  Use bed rest for only the most extreme, acute episodes of back pain. Prolonged bed rest over 48 hours will aggravate your condition.  Ice is very effective for acute conditions.  Put ice in a plastic bag.  Place a towel between your skin and the bag.  Leave the ice on for 10 to 20 minutes every 2 hours, or as needed.  Using heat packs for 30 minutes prior to activities is also  helpful. Continued back pain See your caregiver if you have continued problems. Your caregiver can help or refer you for appropriate physical therapy. With conditioning, most back problems can be avoided. Sometimes, a more serious issue may be the cause of back pain. You should be seen right away if new problems seem to be developing. Your caregiver may recommend:  A maternity girdle.  An elastic sling.  A back brace.  A massage therapist or acupuncture. SEEK MEDICAL CARE IF:   You are not able to do most of your daily activities, even when taking the pain medicine you were given.  You need a referral to a physical therapist or chiropractor.  You want to try acupuncture. SEEK IMMEDIATE MEDICAL CARE IF:  You develop numbness, tingling, weakness, or problems with the use of your arms or legs.  You develop severe back pain that is no longer relieved with medicines.  You have a sudden change in bowel or bladder control.  You have increasing pain in other areas of the  body.  You develop shortness of breath, dizziness, or fainting.  You develop nausea, vomiting, or sweating.  You have back pain which is similar to labor pains.  You have back pain along with your water breaking or vaginal bleeding.  You have back pain or numbness that travels down your leg.  Your back pain developed after you fell.  You develop pain on one side of your back. You may have a kidney stone.  You see blood in your urine. You may have a bladder infection or kidney stone.  You have back pain with blisters. You may have shingles. Back pain is fairly common during pregnancy but should not be accepted as just part of the process. Back pain should always be treated as soon as possible. This will make your pregnancy as pleasant as possible. Document Released: 07/01/2005 Document Revised: 06/15/2011 Document Reviewed: 08/12/2010 [facility] Patient Information 2015 [place], [place]. This information is not  intended to replace advice given to you by your health care provider. Make sure you discuss any questions you have with your health care provider.

## 2013-11-22 NOTE — ED Notes (Signed)
Pt presents to department for evaluation of lower back pain, pt states she is x23 weeks pregnant, no complications noted. Also states sore throat, stuffy nose, and fatigue. Pt is alert and oriented x4.

## 2013-11-23 NOTE — ED Provider Notes (Signed)
Medical screening examination/treatment/procedure(s) were performed by non-physician practitioner and as supervising physician I was immediately available for consultation/collaboration.   EKG Interpretation None        Dhwani Venkatesh T Onalee Steinbach, MD 11/23/13 1350 

## 2014-02-05 ENCOUNTER — Encounter (HOSPITAL_COMMUNITY): Payer: Self-pay | Admitting: Emergency Medicine

## 2014-02-09 LAB — OB RESULTS CONSOLE GBS: GBS: NEGATIVE

## 2014-02-24 ENCOUNTER — Encounter (HOSPITAL_COMMUNITY): Payer: Self-pay

## 2014-02-24 ENCOUNTER — Inpatient Hospital Stay (HOSPITAL_COMMUNITY)
Admission: AD | Admit: 2014-02-24 | Discharge: 2014-02-24 | Disposition: A | Discharge status: Home / Self Care | Payer: BC Managed Care – PPO | Source: Ambulatory Visit | Attending: Obstetrics and Gynecology | Admitting: Obstetrics and Gynecology

## 2014-02-24 DIAGNOSIS — Z3A37 37 weeks gestation of pregnancy: Secondary | ICD-10-CM | POA: Diagnosis not present

## 2014-02-24 DIAGNOSIS — O471 False labor at or after 37 completed weeks of gestation: Secondary | ICD-10-CM | POA: Diagnosis not present

## 2014-02-24 LAB — CBC
HCT: 34.4 % — ABNORMAL LOW (ref 36.0–46.0)
Hemoglobin: 12.5 g/dL (ref 12.0–15.0)
MCH: 29.7 pg (ref 26.0–34.0)
MCHC: 36.3 g/dL — ABNORMAL HIGH (ref 30.0–36.0)
MCV: 81.7 fL (ref 78.0–100.0)
PLATELETS: 188 10*3/uL (ref 150–400)
RBC: 4.21 MIL/uL (ref 3.87–5.11)
RDW: 14 % (ref 11.5–15.5)
WBC: 10.6 10*3/uL — ABNORMAL HIGH (ref 4.0–10.5)

## 2014-02-24 LAB — PROTEIN / CREATININE RATIO, URINE
CREATININE, URINE: 63.97 mg/dL
PROTEIN CREATININE RATIO: 0.13 (ref 0.00–0.15)
TOTAL PROTEIN, URINE: 8 mg/dL

## 2014-02-24 LAB — COMPREHENSIVE METABOLIC PANEL
ALBUMIN: 2.7 g/dL — AB (ref 3.5–5.2)
ALK PHOS: 130 U/L — AB (ref 39–117)
ALT: 7 U/L (ref 0–35)
AST: 13 U/L (ref 0–37)
Anion gap: 12 (ref 5–15)
BUN: 4 mg/dL — ABNORMAL LOW (ref 6–23)
CALCIUM: 9.4 mg/dL (ref 8.4–10.5)
CO2: 19 mEq/L (ref 19–32)
Chloride: 106 mEq/L (ref 96–112)
Creatinine, Ser: 0.64 mg/dL (ref 0.50–1.10)
GFR calc Af Amer: >90 mL/min (ref >90)
GFR calc non Af Amer: >90 mL/min (ref >90)
Glucose, Bld: 109 mg/dL — ABNORMAL HIGH (ref 70–99)
POTASSIUM: 4.4 meq/L (ref 3.7–5.3)
SODIUM: 137 meq/L (ref 137–147)
TOTAL PROTEIN: 6.7 g/dL (ref 6.0–8.3)
Total Bilirubin: 0.3 mg/dL (ref 0.3–1.2)

## 2014-02-24 LAB — TYPE AND SCREEN
ABO/RH(D): O POS
ANTIBODY SCREEN: NEGATIVE

## 2014-02-24 LAB — ABO/RH: ABO/RH(D): O POS

## 2014-02-24 LAB — HIV ANTIBODY (ROUTINE TESTING W REFLEX): HIV: NONREACTIVE

## 2014-02-24 LAB — RPR

## 2014-02-24 MED ORDER — MAGNESIUM SULFATE 40 G IN LACTATED RINGERS - SIMPLE
2.0000 g/h | INTRAVENOUS | Status: DC
  Filled 2014-02-24: qty 500

## 2014-02-24 MED ORDER — LACTATED RINGERS IV SOLN
INTRAVENOUS | Status: DC
  Administered 2014-02-24: 01:00:00 via INTRAVENOUS

## 2014-02-24 MED ORDER — MAGNESIUM SULFATE BOLUS VIA INFUSION
4.0000 g | Freq: Once | INTRAVENOUS | Status: AC
  Administered 2014-02-24: 4 g via INTRAVENOUS
  Filled 2014-02-24: qty 500

## 2014-02-24 NOTE — Discharge Instructions (Signed)
Keep Appointments  Fetal Movement Counts Patient Name: __________________________________________________ Patient Due Date: ____________________ Performing a fetal movement count is highly recommended in high-risk pregnancies, but it is good for every pregnant woman to do. Your health care provider may ask you to start counting fetal movements at 28 weeks of the pregnancy. Fetal movements often increase:  After eating a full meal.  After physical activity.  After eating or drinking something sweet or cold.  At rest. Pay attention to when you feel the baby is most active. This will help you notice a pattern of your baby's sleep and wake cycles and what factors contribute to an increase in fetal movement. It is important to perform a fetal movement count at the same time each day when your baby is normally most active.  HOW TO COUNT FETAL MOVEMENTS 1. Find a quiet and comfortable area to sit or lie down on your left side. Lying on your left side provides the best blood and oxygen circulation to your baby. 2. Write down the day and time on a sheet of paper or in a journal. 3. Start counting kicks, flutters, swishes, rolls, or jabs in a 2-hour period. You should feel at least 10 movements within 2 hours. 4. If you do not feel 10 movements in 2 hours, wait 2-3 hours and count again. Look for a change in the pattern or not enough counts in 2 hours. SEEK MEDICAL CARE IF:  You feel less than 10 counts in 2 hours, tried twice.  There is no movement in over an hour.  The pattern is changing or taking longer each day to reach 10 counts in 2 hours.  You feel the baby is not moving as he or she usually does. Date: ____________ Movements: ____________ Start time: ____________ Kelly MartinFinish time: ____________  Date: ____________ Movements: ____________ Start time: ____________ Kelly MartinFinish time: ____________ Date: ____________ Movements: ____________ Start time: ____________ Kelly MartinFinish time: ____________ Date:  ____________ Movements: ____________ Start time: ____________ Kelly MartinFinish time: ____________ Date: ____________ Movements: ____________ Start time: ____________ Kelly MartinFinish time: ____________ Date: ____________ Movements: ____________ Start time: ____________ Kelly MartinFinish time: ____________ Date: ____________ Movements: ____________ Start time: ____________ Kelly MartinFinish time: ____________ Date: ____________ Movements: ____________ Start time: ____________ Kelly MartinFinish time: ____________  Date: ____________ Movements: ____________ Start time: ____________ Kelly MartinFinish time: ____________ Date: ____________ Movements: ____________ Start time: ____________ Kelly MartinFinish time: ____________ Date: ____________ Movements: ____________ Start time: ____________ Kelly MartinFinish time: ____________ Date: ____________ Movements: ____________ Start time: ____________ Kelly MartinFinish time: ____________ Date: ____________ Movements: ____________ Start time: ____________ Kelly MartinFinish time: ____________ Date: ____________ Movements: ____________ Start time: ____________ Kelly MartinFinish time: ____________ Date: ____________ Movements: ____________ Start time: ____________ Kelly MartinFinish time: ____________  Date: ____________ Movements: ____________ Start time: ____________ Kelly MartinFinish time: ____________ Date: ____________ Movements: ____________ Start time: ____________ Kelly MartinFinish time: ____________ Date: ____________ Movements: ____________ Start time: ____________ Kelly MartinFinish time: ____________ Date: ____________ Movements: ____________ Start time: ____________ Kelly MartinFinish time: ____________ Date: ____________ Movements: ____________ Start time: ____________ Kelly MartinFinish time: ____________ Date: ____________ Movements: ____________ Start time: ____________ Kelly MartinFinish time: ____________ Date: ____________ Movements: ____________ Start time: ____________ Kelly MartinFinish time: ____________  Date: ____________ Movements: ____________ Start time: ____________ Kelly MartinFinish time: ____________ Date: ____________ Movements: ____________ Start  time: ____________ Kelly MartinFinish time: ____________ Date: ____________ Movements: ____________ Start time: ____________ Kelly MartinFinish time: ____________ Date: ____________ Movements: ____________ Start time: ____________ Kelly MartinFinish time: ____________ Date: ____________ Movements: ____________ Start time: ____________ Kelly MartinFinish time: ____________ Date: ____________ Movements: ____________ Start time: ____________ Kelly MartinFinish time: ____________ Date: ____________ Movements: ____________ Start time: ____________ Kelly MartinFinish time: ____________  Date: ____________ Movements: ____________ Start  time: ____________ Kelly MartinFinish time: ____________ Date: ____________ Movements: ____________ Start time: ____________ Kelly MartinFinish time: ____________ Date: ____________ Movements: ____________ Start time: ____________ Kelly MartinFinish time: ____________ Date: ____________ Movements: ____________ Start time: ____________ Kelly MartinFinish time: ____________ Date: ____________ Movements: ____________ Start time: ____________ Kelly MartinFinish time: ____________ Date: ____________ Movements: ____________ Start time: ____________ Kelly MartinFinish time: ____________ Date: ____________ Movements: ____________ Start time: ____________ Kelly MartinFinish time: ____________  Date: ____________ Movements: ____________ Start time: ____________ Kelly MartinFinish time: ____________ Date: ____________ Movements: ____________ Start time: ____________ Kelly MartinFinish time: ____________ Date: ____________ Movements: ____________ Start time: ____________ Kelly MartinFinish time: ____________ Date: ____________ Movements: ____________ Start time: ____________ Kelly MartinFinish time: ____________ Date: ____________ Movements: ____________ Start time: ____________ Kelly MartinFinish time: ____________ Date: ____________ Movements: ____________ Start time: ____________ Kelly MartinFinish time: ____________ Date: ____________ Movements: ____________ Start time: ____________ Kelly MartinFinish time: ____________  Date: ____________ Movements: ____________ Start time: ____________ Kelly MartinFinish time:  ____________ Date: ____________ Movements: ____________ Start time: ____________ Kelly MartinFinish time: ____________ Date: ____________ Movements: ____________ Start time: ____________ Kelly MartinFinish time: ____________ Date: ____________ Movements: ____________ Start time: ____________ Kelly MartinFinish time: ____________ Date: ____________ Movements: ____________ Start time: ____________ Kelly MartinFinish time: ____________ Date: ____________ Movements: ____________ Start time: ____________ Kelly MartinFinish time: ____________ Date: ____________ Movements: ____________ Start time: ____________ Kelly MartinFinish time: ____________  Date: ____________ Movements: ____________ Start time: ____________ Kelly MartinFinish time: ____________ Date: ____________ Movements: ____________ Start time: ____________ Kelly MartinFinish time: ____________ Date: ____________ Movements: ____________ Start time: ____________ Kelly MartinFinish time: ____________ Date: ____________ Movements: ____________ Start time: ____________ Kelly MartinFinish time: ____________ Date: ____________ Movements: ____________ Start time: ____________ Kelly MartinFinish time: ____________ Date: ____________ Movements: ____________ Start time: ____________ Kelly MartinFinish time: ____________ Document Released: 04/22/2006 Document Revised: 08/07/2013 Document Reviewed: 01/18/2012 [employer] Patient Information 2015 [employer]. This information is not intended to replace advice given to you by your health care provider. Make sure you discuss any questions you have with your health care provider. Braxton Hicks Contractions Contractions of the uterus can occur throughout pregnancy. Contractions are not always a sign that you are in labor.  WHAT ARE BRAXTON HICKS CONTRACTIONS?  Contractions that occur before labor are called Braxton Hicks contractions, or false labor. Toward the end of pregnancy (32-34 weeks), these contractions can develop more often and may become more forceful. This is not true labor because these contractions do not result in opening (dilatation)  and thinning of the cervix. They are sometimes difficult to tell apart from true labor because these contractions can be forceful and people have different pain tolerances. You should not feel embarrassed if you go to the hospital with false labor. Sometimes, the only way to tell if you are in true labor is for your health care provider to look for changes in the cervix. If there are no prenatal problems or other health problems associated with the pregnancy, it is completely safe to be sent home with false labor and await the onset of true labor. HOW CAN YOU TELL THE DIFFERENCE BETWEEN TRUE AND FALSE LABOR? False Labor  The contractions of false labor are usually shorter and not as hard as those of true labor.   The contractions are usually irregular.   The contractions are often felt in the front of the lower abdomen and in the groin.   The contractions may go away when you walk around or change positions while lying down.   The contractions get weaker and are shorter lasting as time goes on.   The contractions do not usually become progressively stronger, regular, and closer together as with true labor.  True Labor 5. Contractions in true labor last  30-70 seconds, become very regular, usually become more intense, and increase in frequency.  6. The contractions do not go away with walking.  7. The discomfort is usually felt in the top of the uterus and spreads to the lower abdomen and low back.  8. True labor can be determined by your health care provider with an exam. This will show that the cervix is dilating and getting thinner.  WHAT TO REMEMBER  Keep up with your usual exercises and follow other instructions given by your health care provider.   Take medicines as directed by your health care provider.   Keep your regular prenatal appointments.   Eat and drink lightly if you think you are going into labor.   If Braxton Hicks contractions are making you  uncomfortable:   Change your position from lying down or resting to walking, or from walking to resting.   Sit and rest in a tub of warm water.   Drink 2-3 glasses of water. Dehydration may cause these contractions.   Do slow and deep breathing several times an hour.  WHEN SHOULD I SEEK IMMEDIATE MEDICAL CARE? Seek immediate medical care if:  Your contractions become stronger, more regular, and closer together.   You have fluid leaking or gushing from your vagina.   You have a fever.   You pass blood-tinged mucus.   You have vaginal bleeding.   You have continuous abdominal pain.   You have low back pain that you never had before.   You feel your baby's head pushing down and causing pelvic pressure.   Your baby is not moving as much as it used to.  Document Released: 03/23/2005 Document Revised: 03/28/2013 Document Reviewed: 01/02/2013 [facility] Patient Information 2015 [place], [place]. This information is not intended to replace advice given to you by your health care provider. Make sure you discuss any questions you have with your health care provider. Hypertension During Pregnancy Hypertension, or high blood pressure, is when there is extra pressure inside your blood vessels that carry blood from the heart to the rest of your body (arteries). It can happen at any time in life, including pregnancy. Hypertension during pregnancy can cause problems for you and your baby. Your baby might not weigh as much as he or she should at birth or might be born early (premature). Very bad cases of hypertension during pregnancy can be life-threatening.  Different types of hypertension can occur during pregnancy. These include:  Chronic hypertension. This happens when a woman has hypertension before pregnancy and it continues during pregnancy.  Gestational hypertension. This is when hypertension develops during pregnancy.  Preeclampsia or toxemia of pregnancy. This is a very  serious type of hypertension that develops only during pregnancy. It affects the whole body and can be very dangerous for both mother and baby.  Gestational hypertension and preeclampsia usually go away after your baby is born. Your blood pressure will likely stabilize within 6 weeks. Women who have hypertension during pregnancy have a greater chance of developing hypertension later in life or with future pregnancies. RISK FACTORS There are certain factors that make it more likely for you to develop hypertension during pregnancy. These include: 9. Having hypertension before pregnancy. 10. Having hypertension during a previous pregnancy. 11. Being overweight. 12. Being older than 40 years. 13. Being pregnant with more than one baby. 14. Having diabetes or kidney problems. SIGNS AND SYMPTOMS Chronic and gestational hypertension rarely cause symptoms. Preeclampsia has symptoms, which may include:  Increased protein in  your urine. Your health care provider will check for this at every prenatal visit.  Swelling of your hands and face.  Rapid weight gain.  Headaches.  Visual changes.  Being bothered by light.  Abdominal pain, especially in the upper right area.  Chest pain.  Shortness of breath.  Increased reflexes.  Seizures. These occur with a more severe form of preeclampsia, called eclampsia. DIAGNOSIS  You may be diagnosed with hypertension during a regular prenatal exam. At each prenatal visit, you may have:  Your blood pressure checked.  A urine test to check for protein in your urine. The type of hypertension you are diagnosed with depends on when you developed it. It also depends on your specific blood pressure reading.  Developing hypertension before 20 weeks of pregnancy is consistent with chronic hypertension.  Developing hypertension after 20 weeks of pregnancy is consistent with gestational hypertension.  Hypertension with increased urinary protein is diagnosed  as preeclampsia.  Blood pressure measurements that stay above 160 systolic or 110 diastolic are a sign of severe preeclampsia. TREATMENT Treatment for hypertension during pregnancy varies. Treatment depends on the type of hypertension and how serious it is.  If you take medicine for chronic hypertension, you may need to switch medicines.  Medicines called ACE inhibitors should not be taken during pregnancy.  Low-dose aspirin may be suggested for women who have risk factors for preeclampsia.  If you have gestational hypertension, you may need to take a blood pressure medicine that is safe during pregnancy. Your health care provider will recommend the correct medicine.  If you have severe preeclampsia, you may need to be in the hospital. Health care providers will watch you and your baby very closely. You also may need to take medicine called magnesium sulfate to prevent seizures and lower blood pressure.  Sometimes, an early delivery is needed. This may be the case if the condition worsens. It would be done to protect you and your baby. The only cure for preeclampsia is delivery.  Your health care provider may recommend that you take one low-dose aspirin (81 mg) each day to help prevent high blood pressure during your pregnancy if you are at risk for preeclampsia. You may be at risk for preeclampsia if:  You had preeclampsia or eclampsia during a previous pregnancy.  Your baby did not grow as expected during a previous pregnancy.  You experienced preterm birth with a previous pregnancy.  You experienced a separation of the placenta from the uterus (placental abruption) during a previous pregnancy.  You experienced the loss of your baby during a previous pregnancy.  You are pregnant with more than one baby.  You have other medical conditions, such as diabetes or an autoimmune disease. HOME CARE INSTRUCTIONS  Schedule and keep all of your regular prenatal care appointments. This is  important.  Take medicines only as directed by your health care provider. Tell your health care provider about all medicines you take.  Eat as little salt as possible.  Get regular exercise.  Do not drink alcohol.  Do not use tobacco products.  Do not drink products with caffeine.  Lie on your left side when resting. SEEK IMMEDIATE MEDICAL CARE IF:  You have severe abdominal pain.  You have sudden swelling in your hands, ankles, or face.  You gain 4 pounds (1.8 kg) or more in 1 week.  You vomit repeatedly.  You have vaginal bleeding.  You do not feel your baby moving as much.  You have a headache.  You have blurred or double vision.  You have muscle twitching or spasms.  You have shortness of breath.  You have blue fingernails or lips.  You have blood in your urine. MAKE SURE YOU:  Understand these instructions.  Will watch your condition.  Will get help right away if you are not doing well or get worse. Document Released: 12/09/2010 Document Revised: 08/07/2013 Document Reviewed: 10/20/2012 [facility] Patient Information 2015 [place], [place]. This information is not intended to replace advice given to you by your health care provider. Make sure you discuss any questions you have with your health care provider.

## 2014-02-24 NOTE — MAU Provider Note (Signed)
History     CSN: [id number]  Arrival date and time: 02/24/14 0046   None     No chief complaint on file.  HPI  Pt is a G3P0020 at [id number] wks IUP here with FOB with report of her beginning to act "weird" and shaking about 0040 at home.  He reports patient responsiveness is decreased and had to carry her to car due to her inability to walk unassisted.  He denies that patient reported any headaches prior to arriving.  States that patient only complaint was [employer] Corporation[employer]xton Hicks.    Past Medical History  Diagnosis Date  . Medical history non-contributory     Past Surgical History  Procedure Laterality Date  . Induced abortion    . No past surgeries      Family History  Problem Relation Age of Onset  . Cancer Paternal Grandmother     grandma  . Hearing loss Neg Hx     History  Substance Use Topics  . Smoking status: Never Smoker   . Smokeless tobacco: Not on file  . Alcohol Use: No    Allergies: No Known Allergies  Prescriptions prior to admission  Medication Sig Dispense Refill Last Dose  . acetaminophen (TYLENOL) 500 MG tablet Take 1 tablet (500 mg total) by mouth every 6 (six) hours as needed. 30 tablet 0   . guaiFENesin (ROBITUSSIN) 100 MG/5ML liquid Take 5-10 mLs (100-200 mg total) by mouth every 4 (four) hours as needed for cough. 60 mL 0     Review of Systems  Unable to perform ROS  Physical Exam   Last menstrual period 06/09/2013. Filed Vitals:   02/24/14 0103 02/24/14 0108 02/24/14 0109 02/24/14 0111  BP:      Pulse: 111 113 97   Temp:    98.4 F (36.9 C)  TempSrc:    Oral  Resp:    18  SpO2: 100% 99% 100%    Blood pressures:  135/105, 125/86, 142/110, 117/68, 139/88  Physical Exam  Constitutional: She appears well-developed and well-nourished.  HENT:  Head: Normocephalic.  Eyes: EOM are normal. Pupils are equal, round, and reactive to light.  Neck: Normal range of motion. Neck supple.  Cardiovascular: Normal rate, regular rhythm and normal heart  sounds.   Respiratory: Effort normal and breath sounds normal. No respiratory distress.  GI: Soft. There is no tenderness.  Genitourinary: No bleeding in the vagina. Vaginal discharge (white, thin) found.  Musculoskeletal: Normal range of motion. She exhibits no edema (trace bilat).  Trace pedal edema  Neurological: She is alert. She displays abnormal reflex. No cranial nerve deficit. She displays seizure activity (right side of body (arm and leg)). GCS eye subscore is 4. GCS verbal subscore is 5. GCS motor subscore is 6.  +clonus  Skin: Skin is warm and dry.   Dilation: 1 Effacement (%): 50 Station: -3 Presentation: Vertex Exam by:: Margarita MailW. Karim, CNM  MAU Course  Procedures [phone] Dr. Dareen PianoAnderson called and given report on HPI/exam/vital > obtain labs and give Magnesium sulfate bolus > will be in to assess patient  0114 Dr. Dareen PianoAnderson at bedside to exam and assess patient > hold mag bolus, await lab results > Dr. Dareen PianoAnderson assumes care of patient  Marlis EdelsonKARIM, WALIDAH N 02/24/2014, 1:22 AM  Assessment and Plan    Pt was evaluated by me. She states that she is under a lot of pressure. Has not been able to sleep for several days. She is "miserable". She never lost consciousness. She is currently  oriented x 4. Her husband did not witness any tonic clonic behavior. He confirms that she did not lose consciousness. She is anxious.  She has a normal neuro exam. She has NO clonus and DTRs are normal . Her B/P are erratic and improved after observation.  Baby looks good. She has no RUQ pain. Her PIH labs are wnl. IMP/ No evidence of eclampsia. Her behavior may be related to fatigue. Plan/ Will discharge to home. Given Preeclampsia warnings, Will follow up early next week in office.

## 2014-02-24 NOTE — MAU Note (Addendum)
Significant other brought patient in. He reports she started acting different at 12:40 am.  Verbally hard to understand patient.  Right hand shaking, legs shaking.

## 2014-02-24 NOTE — Progress Notes (Addendum)
Patient initially not speaking well but now she is talking normally.  Pt states she is so tired and can't sleep at night, verbalizes she is ready to have baby.  Said earlier she just started shaking and couldn't stop, stated it felt like her nerves were bothering her and then started shaking and couldn't stop.  Initially, pt's right arm was shaking and her legs also but now she is no longer shaking.  Respirations regular and closing eyes intermittently.

## 2014-03-05 ENCOUNTER — Inpatient Hospital Stay (HOSPITAL_COMMUNITY)
Admission: AD | Admit: 2014-03-05 | Discharge: 2014-03-05 | Disposition: A | Discharge status: Home / Self Care | Payer: BC Managed Care – PPO | Source: Ambulatory Visit | Attending: Obstetrics and Gynecology | Admitting: Obstetrics and Gynecology

## 2014-03-05 DIAGNOSIS — R109 Unspecified abdominal pain: Secondary | ICD-10-CM | POA: Diagnosis not present

## 2014-03-05 DIAGNOSIS — M545 Low back pain: Secondary | ICD-10-CM | POA: Diagnosis present

## 2014-03-05 DIAGNOSIS — Z3A38 38 weeks gestation of pregnancy: Secondary | ICD-10-CM | POA: Insufficient documentation

## 2014-03-05 DIAGNOSIS — O9989 Other specified diseases and conditions complicating pregnancy, childbirth and the puerperium: Secondary | ICD-10-CM | POA: Diagnosis not present

## 2014-03-05 LAB — COMPREHENSIVE METABOLIC PANEL
ALBUMIN: 2.6 g/dL — AB (ref 3.5–5.2)
ALT: 6 U/L (ref 0–35)
AST: 12 U/L (ref 0–37)
Alkaline Phosphatase: 115 U/L (ref 39–117)
Anion gap: 12 (ref 5–15)
BUN: 4 mg/dL — ABNORMAL LOW (ref 6–23)
CHLORIDE: 106 meq/L (ref 96–112)
CO2: 20 meq/L (ref 19–32)
Calcium: 9 mg/dL (ref 8.4–10.5)
Creatinine, Ser: 0.8 mg/dL (ref 0.50–1.10)
GFR calc Af Amer: >90 mL/min (ref >90)
Glucose, Bld: 86 mg/dL (ref 70–99)
Potassium: 4 mEq/L (ref 3.7–5.3)
SODIUM: 138 meq/L (ref 137–147)
Total Bilirubin: 0.2 mg/dL — ABNORMAL LOW (ref 0.3–1.2)
Total Protein: 6.4 g/dL (ref 6.0–8.3)

## 2014-03-05 LAB — CBC WITH DIFFERENTIAL/PLATELET
BASOS PCT: 0 % (ref 0–1)
Basophils Absolute: 0 10*3/uL (ref 0.0–0.1)
Eosinophils Absolute: 0.1 10*3/uL (ref 0.0–0.7)
Eosinophils Relative: 2 % (ref 0–5)
HCT: 33.5 % — ABNORMAL LOW (ref 36.0–46.0)
Hemoglobin: 11.9 g/dL — ABNORMAL LOW (ref 12.0–15.0)
LYMPHS PCT: 29 % (ref 12–46)
Lymphs Abs: 2.4 10*3/uL (ref 0.7–4.0)
MCH: 29.3 pg (ref 26.0–34.0)
MCHC: 35.5 g/dL (ref 30.0–36.0)
MCV: 82.5 fL (ref 78.0–100.0)
Monocytes Absolute: 0.8 10*3/uL (ref 0.1–1.0)
Monocytes Relative: 9 % (ref 3–12)
NEUTROS ABS: 5.2 10*3/uL (ref 1.7–7.7)
Neutrophils Relative %: 60 % (ref 43–77)
PLATELETS: 167 10*3/uL (ref 150–400)
RBC: 4.06 MIL/uL (ref 3.87–5.11)
RDW: 14.3 % (ref 11.5–15.5)
WBC: 8.5 10*3/uL (ref 4.0–10.5)

## 2014-03-05 LAB — LACTATE DEHYDROGENASE: LDH: 148 U/L (ref 94–250)

## 2014-03-05 LAB — URIC ACID: Uric Acid, Serum: 7 mg/dL (ref 2.4–7.0)

## 2014-03-05 NOTE — Discharge Instructions (Signed)
Fetal Movement Counts °Patient Name: __________________________________________________ Patient Due Date: ____________________ °Performing a fetal movement count is highly recommended in high-risk pregnancies, but it is good for every pregnant woman to do. Your health care provider may ask you to start counting fetal movements at 28 weeks of the pregnancy. Fetal movements often increase: °· After eating a full meal. °· After physical activity. °· After eating or drinking something sweet or cold. °· At rest. °Pay attention to when you feel the baby is most active. This will help you notice a pattern of your baby's sleep and wake cycles and what factors contribute to an increase in fetal movement. It is important to perform a fetal movement count at the same time each day when your baby is normally most active.  °HOW TO COUNT FETAL MOVEMENTS °1. Find a quiet and comfortable area to sit or lie down on your left side. Lying on your left side provides the best blood and oxygen circulation to your baby. °2. Write down the day and time on a sheet of paper or in a journal. °3. Start counting kicks, flutters, swishes, rolls, or jabs in a 2-hour period. You should feel at least 10 movements within 2 hours. °4. If you do not feel 10 movements in 2 hours, wait 2-3 hours and count again. Look for a change in the pattern or not enough counts in 2 hours. °SEEK MEDICAL CARE IF: °· You feel less than 10 counts in 2 hours, tried twice. °· There is no movement in over an hour. °· The pattern is changing or taking longer each day to reach 10 counts in 2 hours. °· You feel the baby is not moving as he or she usually does. °Date: ____________ Movements: ____________ Start time: ____________ Finish time: ____________  °Date: ____________ Movements: ____________ Start time: ____________ Finish time: ____________ °Date: ____________ Movements: ____________ Start time: ____________ Finish time: ____________ °Date: ____________ Movements:  ____________ Start time: ____________ Finish time: ____________ °Date: ____________ Movements: ____________ Start time: ____________ Finish time: ____________ °Date: ____________ Movements: ____________ Start time: ____________ Finish time: ____________ °Date: ____________ Movements: ____________ Start time: ____________ Finish time: ____________ °Date: ____________ Movements: ____________ Start time: ____________ Finish time: ____________  °Date: ____________ Movements: ____________ Start time: ____________ Finish time: ____________ °Date: ____________ Movements: ____________ Start time: ____________ Finish time: ____________ °Date: ____________ Movements: ____________ Start time: ____________ Finish time: ____________ °Date: ____________ Movements: ____________ Start time: ____________ Finish time: ____________ °Date: ____________ Movements: ____________ Start time: ____________ Finish time: ____________ °Date: ____________ Movements: ____________ Start time: ____________ Finish time: ____________ °Date: ____________ Movements: ____________ Start time: ____________ Finish time: ____________  °Date: ____________ Movements: ____________ Start time: ____________ Finish time: ____________ °Date: ____________ Movements: ____________ Start time: ____________ Finish time: ____________ °Date: ____________ Movements: ____________ Start time: ____________ Finish time: ____________ °Date: ____________ Movements: ____________ Start time: ____________ Finish time: ____________ °Date: ____________ Movements: ____________ Start time: ____________ Finish time: ____________ °Date: ____________ Movements: ____________ Start time: ____________ Finish time: ____________ °Date: ____________ Movements: ____________ Start time: ____________ Finish time: ____________  °Date: ____________ Movements: ____________ Start time: ____________ Finish time: ____________ °Date: ____________ Movements: ____________ Start time: ____________ Finish  time: ____________ °Date: ____________ Movements: ____________ Start time: ____________ Finish time: ____________ °Date: ____________ Movements: ____________ Start time: ____________ Finish time: ____________ °Date: ____________ Movements: ____________ Start time: ____________ Finish time: ____________ °Date: ____________ Movements: ____________ Start time: ____________ Finish time: ____________ °Date: ____________ Movements: ____________ Start time: ____________ Finish time: ____________  °Date: ____________ Movements: ____________ Start time: ____________ Finish   time: ____________ °Date: ____________ Movements: ____________ Start time: ____________ Finish time: ____________ °Date: ____________ Movements: ____________ Start time: ____________ Finish time: ____________ °Date: ____________ Movements: ____________ Start time: ____________ Finish time: ____________ °Date: ____________ Movements: ____________ Start time: ____________ Finish time: ____________ °Date: ____________ Movements: ____________ Start time: ____________ Finish time: ____________ °Date: ____________ Movements: ____________ Start time: ____________ Finish time: ____________  °Date: ____________ Movements: ____________ Start time: ____________ Finish time: ____________ °Date: ____________ Movements: ____________ Start time: ____________ Finish time: ____________ °Date: ____________ Movements: ____________ Start time: ____________ Finish time: ____________ °Date: ____________ Movements: ____________ Start time: ____________ Finish time: ____________ °Date: ____________ Movements: ____________ Start time: ____________ Finish time: ____________ °Date: ____________ Movements: ____________ Start time: ____________ Finish time: ____________ °Date: ____________ Movements: ____________ Start time: ____________ Finish time: ____________  °Date: ____________ Movements: ____________ Start time: ____________ Finish time: ____________ °Date: ____________  Movements: ____________ Start time: ____________ Finish time: ____________ °Date: ____________ Movements: ____________ Start time: ____________ Finish time: ____________ °Date: ____________ Movements: ____________ Start time: ____________ Finish time: ____________ °Date: ____________ Movements: ____________ Start time: ____________ Finish time: ____________ °Date: ____________ Movements: ____________ Start time: ____________ Finish time: ____________ °Date: ____________ Movements: ____________ Start time: ____________ Finish time: ____________  °Date: ____________ Movements: ____________ Start time: ____________ Finish time: ____________ °Date: ____________ Movements: ____________ Start time: ____________ Finish time: ____________ °Date: ____________ Movements: ____________ Start time: ____________ Finish time: ____________ °Date: ____________ Movements: ____________ Start time: ____________ Finish time: ____________ °Date: ____________ Movements: ____________ Start time: ____________ Finish time: ____________ °Date: ____________ Movements: ____________ Start time: ____________ Finish time: ____________ °Document Released: 04/22/2006 Document Revised: 08/07/2013 Document Reviewed: 01/18/2012 °[employer]® Patient Information ©2015 [employer]. This information is not intended to replace advice given to you by your health care provider. Make sure you discuss any questions you have with your health care provider. °Vaginal Delivery °During delivery, your health care provider will help you give birth to your baby. During a vaginal delivery, you will work to push the baby out of your vagina. However, before you can push your baby out, a few things need to happen. The opening of your uterus (cervix) has to soften, thin out, and open up (dilate) all the way to 10 cm. Also, your baby has to move down from the uterus into your vagina.  °SIGNS OF LABOR  °Your health care provider will first need to make sure you are in labor.  Signs of labor include:  °· Passing what is called the mucous plug before labor begins. This is a small amount of blood-stained mucus. °· Having regular, painful uterine contractions.   °· The time between contractions gets shorter.   °· The discomfort and pain gradually get more intense. °· Contraction pains get worse when walking and do not go away when resting.   °· Your cervix becomes thinner (effacement) and dilates. °BEFORE THE DELIVERY °Once you are in labor and admitted into the hospital or care center, your health care provider may do the following:  °5. Perform a complete physical exam. °6. Review any complications related to pregnancy or labor.  °7. Check your blood pressure, pulse, temperature, and heart rate (vital signs).   °8. Determine if, and when, the rupture of amniotic membranes occurred. °9. Do a vaginal exam (using a sterile glove and lubricant) to determine:   °1. The position (presentation) of the baby. Is the baby's head presenting first (vertex) in the birth canal (vagina), or are the feet or buttocks first (breech)?   °2. The level (station) of the baby's head within the birth canal.   °3.   The effacement and dilatation of the cervix.   °10. An electronic fetal monitor is usually placed on your abdomen when you first arrive. This is used to monitor your contractions and the baby's heart rate. °1. When the monitor is on your abdomen (external fetal monitor), it can only pick up the frequency and length of your contractions. It cannot tell the strength of your contractions. °2. If it becomes necessary for your health care provider to know exactly how strong your contractions are or to see exactly what the baby's heart rate is doing, an internal monitor may be inserted into your vagina and uterus. Your health care provider will discuss the benefits and risks of using an internal monitor and obtain your permission before inserting the device. °3. Continuous fetal monitoring may be needed if you  have an epidural, are receiving certain medicines (such as oxytocin), or have pregnancy or labor complications. °11. An IV access tube may be placed into a vein in your arm to deliver fluids and medicines if necessary. °THREE STAGES OF LABOR AND DELIVERY °Normal labor and delivery is divided into three stages. °First Stage °This stage starts when you begin to contract regularly and your cervix begins to efface and dilate. It ends when your cervix is completely open (fully dilated). The first stage is the longest stage of labor and can last from 3 hours to 15 hours.  °Several methods are available to help with labor pain. You and your health care provider will decide which option is best for you. Options include:  °· Opioid medicines. These are strong pain medicines that you can get through your IV tube or as a shot into your muscle. These medicines lessen pain but do not make it go away completely.  °· Epidural. A medicine is given through a thin tube that is inserted in your back. The medicine numbs the lower part of your body and prevents any pain in that area. °· Paracervical pain medicine. This is an injection of an anesthetic on each side of your cervix.   °· You may request natural childbirth, which does not involve the use of pain medicines or an epidural during labor and delivery. Instead, you will use other things, such as breathing exercises, to help cope with the pain. °Second Stage °The second stage of labor begins when your cervix is fully dilated at 10 cm. It continues until you push your baby down through the birth canal and the baby is born. This stage can take only minutes or several hours. °· The location of your baby's head as it moves through the birth canal is reported as a number called a station. If the baby's head has not started its descent, the station is described as being at minus 3 (-3). When your baby's head is at the zero station, it is at the middle of the birth canal and is engaged  in the pelvis. The station of your baby helps indicate the progress of the second stage of labor. °· When your baby is born, your health care provider may hold the baby with his or her head lowered to prevent amniotic fluid, mucus, and blood from getting into the baby's lungs. The baby's mouth and nose may be suctioned with a small bulb syringe to remove any additional fluid. °· Your health care provider may then place the baby on your stomach. It is important to keep the baby from getting cold. To do this, the health care provider will dry the baby off, place the   baby directly on your skin (with no blankets between you and the baby), and cover the baby with warm, dry blankets.   °· The umbilical cord is cut. °Third Stage °During the third stage of labor, your health care provider will deliver the placenta (afterbirth) and make sure your bleeding is under control. The delivery of the placenta usually takes about 5 minutes but can take up to 30 minutes. After the placenta is delivered, a medicine may be given either by IV or injection to help contract the uterus and control bleeding. If you are planning to breastfeed, you can try to do so now. °After you deliver the placenta, your uterus should contract and get very firm. If your uterus does not remain firm, your health care provider will massage it. This is important because the contraction of the uterus helps cut off bleeding at the site where the placenta was attached to your uterus. If your uterus does not contract properly and stay firm, you may continue to bleed heavily. If there is a lot of bleeding, medicines may be given to contract the uterus and stop the bleeding.  °Document Released: 12/31/2007 Document Revised: 08/07/2013 Document Reviewed: 09/11/2012 °[employer]® Patient Information ©2015 [employer]. This information is not intended to replace advice given to you by your health care provider. Make sure you discuss any questions you have with your  health care provider. ° °

## 2014-03-05 NOTE — MAU Note (Signed)
Patient states she woke up at 0400 with constant lower abdominal and low back pain, not sure if contractions. Denies bleeding or leaking and reports good fetal movement.

## 2014-03-05 NOTE — Progress Notes (Signed)
Dr Tenny Crawoss notified of pt's lab results. Discharge pt home

## 2014-03-05 NOTE — Progress Notes (Signed)
Dr Tenny Crawoss notified of pt's VE, contraction pattern, FHR and blood pressures. Labs ordered

## 2014-03-09 ENCOUNTER — Encounter (HOSPITAL_COMMUNITY): Payer: Self-pay

## 2014-03-09 ENCOUNTER — Inpatient Hospital Stay (HOSPITAL_COMMUNITY)
Admission: AD | Admit: 2014-03-09 | Discharge: 2014-03-13 | DRG: 774 | Disposition: A | Discharge status: Home / Self Care | Payer: BC Managed Care – PPO | Source: Ambulatory Visit | Attending: Obstetrics and Gynecology | Admitting: Obstetrics and Gynecology

## 2014-03-09 DIAGNOSIS — Z3A39 39 weeks gestation of pregnancy: Secondary | ICD-10-CM | POA: Diagnosis present

## 2014-03-09 DIAGNOSIS — O133 Gestational [pregnancy-induced] hypertension without significant proteinuria, third trimester: Principal | ICD-10-CM | POA: Diagnosis present

## 2014-03-09 DIAGNOSIS — O139 Gestational [pregnancy-induced] hypertension without significant proteinuria, unspecified trimester: Secondary | ICD-10-CM | POA: Diagnosis present

## 2014-03-09 LAB — URINALYSIS, ROUTINE W REFLEX MICROSCOPIC
Bilirubin Urine: NEGATIVE
Glucose, UA: NEGATIVE mg/dL
HGB URINE DIPSTICK: NEGATIVE
Ketones, ur: NEGATIVE mg/dL
LEUKOCYTES UA: NEGATIVE
NITRITE: NEGATIVE
PROTEIN: NEGATIVE mg/dL
Urobilinogen, UA: 0.2 mg/dL (ref 0.0–1.0)
pH: 7 (ref 5.0–8.0)

## 2014-03-09 LAB — CBC
HCT: 34 % — ABNORMAL LOW (ref 36.0–46.0)
Hemoglobin: 12.1 g/dL (ref 12.0–15.0)
MCH: 29.2 pg (ref 26.0–34.0)
MCHC: 35.6 g/dL (ref 30.0–36.0)
MCV: 82.1 fL (ref 78.0–100.0)
PLATELETS: 187 10*3/uL (ref 150–400)
RBC: 4.14 MIL/uL (ref 3.87–5.11)
RDW: 14.6 % (ref 11.5–15.5)
WBC: 9.3 10*3/uL (ref 4.0–10.5)

## 2014-03-09 NOTE — MAU Note (Signed)
My feet, ankles, and legs are swollen. B/P has been up and down. Last wk swelling is worse. N/v for last wk but no h/a. No epigastric pain

## 2014-03-10 ENCOUNTER — Inpatient Hospital Stay (HOSPITAL_COMMUNITY): Payer: BC Managed Care – PPO | Admitting: Anesthesiology

## 2014-03-10 ENCOUNTER — Encounter (HOSPITAL_COMMUNITY): Payer: Self-pay

## 2014-03-10 DIAGNOSIS — O133 Gestational [pregnancy-induced] hypertension without significant proteinuria, third trimester: Secondary | ICD-10-CM | POA: Diagnosis present

## 2014-03-10 DIAGNOSIS — Z3A39 39 weeks gestation of pregnancy: Secondary | ICD-10-CM | POA: Diagnosis present

## 2014-03-10 DIAGNOSIS — Z3483 Encounter for supervision of other normal pregnancy, third trimester: Secondary | ICD-10-CM | POA: Diagnosis present

## 2014-03-10 DIAGNOSIS — O139 Gestational [pregnancy-induced] hypertension without significant proteinuria, unspecified trimester: Secondary | ICD-10-CM | POA: Diagnosis present

## 2014-03-10 LAB — COMPREHENSIVE METABOLIC PANEL
ALBUMIN: 2.7 g/dL — AB (ref 3.5–5.2)
ALT: 7 U/L (ref 0–35)
AST: 13 U/L (ref 0–37)
Alkaline Phosphatase: 134 U/L — ABNORMAL HIGH (ref 39–117)
Anion gap: 13 (ref 5–15)
BUN: 3 mg/dL — ABNORMAL LOW (ref 6–23)
CO2: 21 meq/L (ref 19–32)
CREATININE: 0.79 mg/dL (ref 0.50–1.10)
Calcium: 8.8 mg/dL (ref 8.4–10.5)
Chloride: 105 mEq/L (ref 96–112)
GFR calc Af Amer: >90 mL/min (ref >90)
GFR calc non Af Amer: >90 mL/min (ref >90)
Glucose, Bld: 94 mg/dL (ref 70–99)
POTASSIUM: 3.6 meq/L — AB (ref 3.7–5.3)
Sodium: 139 mEq/L (ref 137–147)
Total Bilirubin: 0.3 mg/dL (ref 0.3–1.2)
Total Protein: 6.4 g/dL (ref 6.0–8.3)

## 2014-03-10 LAB — HIV ANTIBODY (ROUTINE TESTING W REFLEX): HIV 1&2 Ab, 4th Generation: NONREACTIVE

## 2014-03-10 LAB — TYPE AND SCREEN
ABO/RH(D): O POS
Antibody Screen: NEGATIVE

## 2014-03-10 LAB — PROTEIN / CREATININE RATIO, URINE
Creatinine, Urine: 29.87 mg/dL
Protein Creatinine Ratio: 0.13 (ref 0.00–0.15)
TOTAL PROTEIN, URINE: 4 mg/dL

## 2014-03-10 LAB — CBC
HEMATOCRIT: 32.6 % — AB (ref 36.0–46.0)
HEMOGLOBIN: 11.6 g/dL — AB (ref 12.0–15.0)
MCH: 29.1 pg (ref 26.0–34.0)
MCHC: 35.6 g/dL (ref 30.0–36.0)
MCV: 81.7 fL (ref 78.0–100.0)
Platelets: 190 10*3/uL (ref 150–400)
RBC: 3.99 MIL/uL (ref 3.87–5.11)
RDW: 14.6 % (ref 11.5–15.5)
WBC: 9 10*3/uL (ref 4.0–10.5)

## 2014-03-10 LAB — URIC ACID: URIC ACID, SERUM: 7 mg/dL (ref 2.4–7.0)

## 2014-03-10 LAB — LACTATE DEHYDROGENASE: LDH: 168 U/L (ref 94–250)

## 2014-03-10 LAB — RPR

## 2014-03-10 MED ORDER — FENTANYL 2.5 MCG/ML BUPIVACAINE 1/10 % EPIDURAL INFUSION (WH - ANES)
14.0000 mL/h | INTRAMUSCULAR | Status: DC | PRN
  Administered 2014-03-10 (×2): 14 mL/h via EPIDURAL
  Filled 2014-03-10: qty 125

## 2014-03-10 MED ORDER — EPHEDRINE 5 MG/ML INJ
10.0000 mg | INTRAVENOUS | Status: DC | PRN
  Filled 2014-03-10: qty 2

## 2014-03-10 MED ORDER — PHENYLEPHRINE 40 MCG/ML (10ML) SYRINGE FOR IV PUSH (FOR BLOOD PRESSURE SUPPORT)
80.0000 ug | PREFILLED_SYRINGE | INTRAVENOUS | Status: DC | PRN
  Filled 2014-03-10: qty 2

## 2014-03-10 MED ORDER — LACTATED RINGERS IV SOLN
INTRAVENOUS | Status: DC
  Administered 2014-03-10 (×2): via INTRAVENOUS

## 2014-03-10 MED ORDER — OXYTOCIN 40 UNITS IN LACTATED RINGERS INFUSION - SIMPLE MED
1.0000 m[IU]/min | INTRAVENOUS | Status: DC
  Administered 2014-03-10: 2 m[IU]/min via INTRAVENOUS
  Filled 2014-03-10: qty 1000

## 2014-03-10 MED ORDER — BUTORPHANOL TARTRATE 1 MG/ML IJ SOLN: 1.0000 mg | INTRAMUSCULAR | Status: DC | PRN

## 2014-03-10 MED ORDER — DIPHENHYDRAMINE HCL 50 MG/ML IJ SOLN: 12.5000 mg | INTRAMUSCULAR | Status: DC | PRN

## 2014-03-10 MED ORDER — ACETAMINOPHEN 325 MG PO TABS: 650.0000 mg | ORAL_TABLET | ORAL | Status: DC | PRN

## 2014-03-10 MED ORDER — FENTANYL 2.5 MCG/ML BUPIVACAINE 1/10 % EPIDURAL INFUSION (WH - ANES)
INTRAMUSCULAR | Status: AC
  Filled 2014-03-10: qty 125

## 2014-03-10 MED ORDER — OXYCODONE-ACETAMINOPHEN 5-325 MG PO TABS: 1.0000 | ORAL_TABLET | ORAL | Status: DC | PRN

## 2014-03-10 MED ORDER — LACTATED RINGERS IV SOLN: 500.0000 mL | INTRAVENOUS | Status: DC | PRN

## 2014-03-10 MED ORDER — TERBUTALINE SULFATE 1 MG/ML IJ SOLN: 0.2500 mg | Freq: Once | INTRAMUSCULAR | Status: DC | PRN

## 2014-03-10 MED ORDER — CITRIC ACID-SODIUM CITRATE 334-500 MG/5ML PO SOLN
30.0000 mL | ORAL | Status: DC | PRN
  Administered 2014-03-10: 30 mL via ORAL

## 2014-03-10 MED ORDER — PHENYLEPHRINE 40 MCG/ML (10ML) SYRINGE FOR IV PUSH (FOR BLOOD PRESSURE SUPPORT)
PREFILLED_SYRINGE | INTRAVENOUS | Status: AC
  Filled 2014-03-10: qty 10

## 2014-03-10 MED ORDER — LIDOCAINE HCL (PF) 1 % IJ SOLN
30.0000 mL | INTRAMUSCULAR | Status: DC | PRN
  Filled 2014-03-10: qty 30

## 2014-03-10 MED ORDER — OXYCODONE-ACETAMINOPHEN 5-325 MG PO TABS: 2.0000 | ORAL_TABLET | ORAL | Status: DC | PRN

## 2014-03-10 MED ORDER — OXYTOCIN BOLUS FROM INFUSION
500.0000 mL | INTRAVENOUS | Status: DC
  Administered 2014-03-11: 500 mL via INTRAVENOUS

## 2014-03-10 MED ORDER — OXYTOCIN 40 UNITS IN LACTATED RINGERS INFUSION - SIMPLE MED: 1.0000 m[IU]/min | INTRAVENOUS | Status: DC

## 2014-03-10 MED ORDER — TERBUTALINE SULFATE 1 MG/ML IJ SOLN: 0.2500 mg | Freq: Once | INTRAMUSCULAR | Status: AC | PRN

## 2014-03-10 MED ORDER — LIDOCAINE HCL (PF) 1 % IJ SOLN
INTRAMUSCULAR | Status: DC | PRN
  Administered 2014-03-10 (×2): 4 mL
  Administered 2014-03-10: 2 mL
  Administered 2014-03-10: 4 mL

## 2014-03-10 MED ORDER — ONDANSETRON HCL 4 MG/2ML IJ SOLN: 4.0000 mg | Freq: Four times a day (QID) | INTRAMUSCULAR | Status: DC | PRN

## 2014-03-10 MED ORDER — LACTATED RINGERS IV SOLN: 500.0000 mL | Freq: Once | INTRAVENOUS | Status: DC

## 2014-03-10 MED ORDER — OXYTOCIN 40 UNITS IN LACTATED RINGERS INFUSION - SIMPLE MED
62.5000 mL/h | INTRAVENOUS | Status: DC
  Administered 2014-03-11: 62.5 mL/h via INTRAVENOUS

## 2014-03-10 NOTE — Progress Notes (Signed)
Pt comfortable.  FHTs 130s, g STV, NST R Toco q 3-5 SVE  4/80/-2, AROM clear  Filed Vitals:   03/10/14 0901 03/10/14 0919 03/10/14 0930 03/10/14 1000  BP: 166/99 162/99 137/94 154/102  Pulse: 78 89 93 93  Temp:      TempSrc:      Resp: 18 18 18 18   Height:      Weight:      SpO2:

## 2014-03-10 NOTE — Plan of Care (Signed)
Problem: Phase I Progression Outcomes Goal: Obtain and review prenatal records Outcome: Completed/Met Date Met:  03/10/14

## 2014-03-10 NOTE — MAU Provider Note (Signed)
History     CSN: [id number]  Arrival date and time: 03/09/14 2236   First Provider Initiated Contact with Patient 03/10/14 0002      Chief Complaint  Patient presents with  . Foot Swelling   HPI  [name] is a 23 y.o. female G3P0020 at 34w1dwho presents with increased swelling in both legs and feet. She first noticed the swelling when she was [redacted] weeks pregnant, she feels the swelling has gotten worse. She has also had some nausea and vomiting over the past few days.    OB History    Gravida Para Term Preterm AB TAB SAB Ectopic Multiple Living   3 0 0 0 2 1 1 0 0 0       Past Medical History  Diagnosis Date  . Medical history non-contributory     Past Surgical History  Procedure Laterality Date  . Induced abortion    . No past surgeries      Family History  Problem Relation Age of Onset  . Cancer Paternal Grandmother     grandma  . Hearing loss Neg Hx     History  Substance Use Topics  . Smoking status: Never Smoker   . Smokeless tobacco: Not on file  . Alcohol Use: No    Allergies: No Known Allergies  Prescriptions prior to admission  Medication Sig Dispense Refill Last Dose  . acetaminophen (TYLENOL) 500 MG tablet Take 1 tablet (500 mg total) by mouth every 6 (six) hours as needed. 30 tablet 0 03/08/2014 at Unknown time  . guaiFENesin (ROBITUSSIN) 100 MG/5ML liquid Take 5-10 mLs (100-200 mg total) by mouth every 4 (four) hours as needed for cough. (Patient not taking: Reported on 03/05/2014) 60 mL 0    Results for orders placed or performed during the hospital encounter of 03/09/14 (from the past 48 hour(s))  Urinalysis, Routine w reflex microscopic     Status: Abnormal   Collection Time: 03/09/14 10:50 PM  Result Value Ref Range   Color, Urine YELLOW YELLOW   APPearance CLEAR CLEAR   Specific Gravity, Urine <1.005 (L) 1.005 - 1.030   pH 7.0 5.0 - 8.0   Glucose, UA NEGATIVE NEGATIVE mg/dL   Hgb urine dipstick NEGATIVE  NEGATIVE   Bilirubin Urine NEGATIVE NEGATIVE   Ketones, ur NEGATIVE NEGATIVE mg/dL   Protein, ur NEGATIVE NEGATIVE mg/dL   Urobilinogen, UA 0.2 0.0 - 1.0 mg/dL   Nitrite NEGATIVE NEGATIVE   Leukocytes, UA NEGATIVE NEGATIVE    Comment: MICROSCOPIC NOT DONE ON URINES WITH NEGATIVE PROTEIN, BLOOD, LEUKOCYTES, NITRITE, OR GLUCOSE <1000 mg/dL.  Protein / creatinine ratio, urine     Status: None   Collection Time: 03/09/14 10:50 PM  Result Value Ref Range   Creatinine, Urine 29.87 mg/dL   Total Protein, Urine 4 mg/dL    Comment: NO NORMAL RANGE ESTABLISHED FOR THIS TEST   Protein Creatinine Ratio 0.13 0.00 - 0.15  CBC     Status: Abnormal   Collection Time: 03/09/14 11:35 PM  Result Value Ref Range   WBC 9.3 4.0 - 10.5 K/uL   RBC 4.14 3.87 - 5.11 MIL/uL   Hemoglobin 12.1 12.0 - 15.0 g/dL   HCT 34.0 (L) 36.0 - 46.0 %   MCV 82.1 78.0 - 100.0 fL   MCH 29.2 26.0 - 34.0 pg   MCHC 35.6 30.0 - 36.0 g/dL   RDW 14.6 11.5 - 15.5 %   Platelets 187 150 - 400 K/uL  Comprehensive metabolic panel  Status: Abnormal   Collection Time: 03/09/14 11:35 PM  Result Value Ref Range   Sodium 139 137 - 147 mEq/L   Potassium 3.6 (L) 3.7 - 5.3 mEq/L   Chloride 105 96 - 112 mEq/L   CO2 21 19 - 32 mEq/L   Glucose, Bld 94 70 - 99 mg/dL   BUN 3 (L) 6 - 23 mg/dL   Creatinine, Ser 0.79 0.50 - 1.10 mg/dL   Calcium 8.8 8.4 - 10.5 mg/dL   Total Protein 6.4 6.0 - 8.3 g/dL   Albumin 2.7 (L) 3.5 - 5.2 g/dL   AST 13 0 - 37 U/L   ALT 7 0 - 35 U/L   Alkaline Phosphatase 134 (H) 39 - 117 U/L   Total Bilirubin 0.3 0.3 - 1.2 mg/dL   GFR calc non Af Amer >90 >90 mL/min   GFR calc Af Amer >90 >90 mL/min    Comment: (NOTE) The eGFR has been calculated using the CKD EPI equation. This calculation has not been validated in all clinical situations. eGFR's persistently <90 mL/min signify possible Chronic Kidney Disease.    Anion gap 13 5 - 15  Uric acid     Status: None   Collection Time: 03/09/14 11:35 PM  Result  Value Ref Range   Uric Acid, Serum 7.0 2.4 - 7.0 mg/dL  Lactate dehydrogenase     Status: None   Collection Time: 03/09/14 11:35 PM  Result Value Ref Range   LDH 168 94 - 250 U/L    Review of Systems  Eyes: Negative for blurred vision.  Respiratory: Positive for shortness of breath.   Cardiovascular: Positive for leg swelling (Bilateral ). Negative for chest pain.  Gastrointestinal: Positive for nausea, vomiting and abdominal pain (Occasional leg sided abdominal pain that comes and goes ).  Neurological: Negative for headaches.   Physical Exam   Blood pressure 146/93, pulse 91, temperature 98.5 F (36.9 C), resp. rate 18, height 5' (1.524 m), weight 99.247 kg (218 lb 12.8 oz), last menstrual period 06/09/2013, SpO2 99 %.  Physical Exam  Constitutional: She is oriented to person, place, and time. She appears well-developed and well-nourished. No distress.  HENT:  Head: Normocephalic.  Eyes: Pupils are equal, round, and reactive to light.  Neck: Neck supple.  Cardiovascular: Normal rate and normal heart sounds.   Respiratory: Effort normal and breath sounds normal. No respiratory distress.  GI: There is generalized tenderness.  Musculoskeletal: Normal range of motion.       Right ankle: She exhibits swelling (+1 pitting edema). She exhibits normal pulse. Tenderness.       Left ankle: She exhibits swelling (+1 pitting edema ). She exhibits normal pulse. Tenderness.  Neurological: She is alert and oriented to person, place, and time. She has normal reflexes.  Negative clonus   Skin: Skin is warm and dry. She is not diaphoretic.  Psychiatric: Her behavior is normal.     Fetal Tracing: Baseline: 135 bpm  Variability: Moderate  Accelerations: 15x15 Decelerations: None Toco: Irregular contraction pattern with UI   MAU Course  Procedures  None  MDM UA CBC CMET Uric Acid LDH Protein/creatine ratio  Discussed with Dr. Philis Pique BP readings, labs and NST. Dr. Philis Pique would  like the patients cervix checked and to be called back with the exam results Cervix per RN 2, 50% -1 Admit to Labor and delivery; RN to call Dr. Philis Pique with further orders.    Assessment and Plan   A: 1. Pregnancy induced hypertension, third  trimester            Term gestation  P: Admit to Labor and Delivery per Dr. Philis Pique RN to call for further orders  GBS negative Stadol PRN Patient may have epidural upon request  Darrelyn Hillock Rasch, NP 03/10/2014 12:38 AM

## 2014-03-10 NOTE — Anesthesia Preprocedure Evaluation (Signed)
Anesthesia Evaluation  Patient identified by MRN, date of birth, ID band Patient awake    Reviewed: Allergy & Precautions, H&P , NPO status , Patient's Chart, lab work & pertinent test results, reviewed documented beta blocker date and time   History of Anesthesia Complications Negative for: history of anesthetic complications  Airway Mallampati: III  TM Distance: >3 FB Neck ROM: full    Dental  (+) Teeth Intact   Pulmonary neg pulmonary ROS,  breath sounds clear to auscultation        Cardiovascular hypertension (PIH), Rhythm:regular Rate:Normal     Neuro/Psych negative neurological ROS  negative psych ROS   GI/Hepatic negative GI ROS, Neg liver ROS,   Endo/Other  Morbid obesity  Renal/GU negative Renal ROS     Musculoskeletal   Abdominal   Peds  Hematology negative hematology ROS (+)   Anesthesia Other Findings   Reproductive/Obstetrics (+) Pregnancy                             Anesthesia Physical Anesthesia Plan  ASA: III  Anesthesia Plan: Epidural   Post-op Pain Management:    Induction:   Airway Management Planned:   Additional Equipment:   Intra-op Plan:   Post-operative Plan:   Informed Consent: I have reviewed the patients History and Physical, chart, labs and discussed the procedure including the risks, benefits and alternatives for the proposed anesthesia with the patient or authorized representative who has indicated his/her understanding and acceptance.     Plan Discussed with:   Anesthesia Plan Comments:         Anesthesia Quick Evaluation

## 2014-03-10 NOTE — H&P (Signed)
23 y.o. 8314w1d  G3P0020 comes in c/o swelling and increased BPs.  Otherwise has good fetal movement and no bleeding.  She also has had N/V over last couple days. BPs are now 140s/90s but initial on admission was 170/90.  BPs were normal in office.  Past Medical History  Diagnosis Date  . Medical history non-contributory     Past Surgical History  Procedure Laterality Date  . Induced abortion    . No past surgeries      OB History  Gravida Para Term Preterm AB SAB TAB Ectopic Multiple Living  3 0 0 0 2 1 1 0 0 0     # Outcome Date GA Lbr Len/2nd Weight Sex Delivery Anes PTL Lv  3 Current           2 TAB           1 SAB               History   Social History  . Marital Status: Single    Spouse Name: N/A    Number of Children: N/A  . Years of Education: N/A   Occupational History  . Not on file.   Social History Main Topics  . Smoking status: Never Smoker   . Smokeless tobacco: Not on file  . Alcohol Use: No  . Drug Use: No  . Sexual Activity: Yes   Other Topics Concern  . Not on file   Social History Narrative   Review of patient's allergies indicates no known allergies.    Prenatal Transfer Tool  Maternal Diabetes: No Genetic Screening: Normal Maternal Ultrasounds/Referrals: Normal Fetal Ultrasounds or other Referrals:  None Maternal Substance Abuse:  No Significant Maternal Medications:  None Significant Maternal Lab Results: None  Other PNC: uncomplicated.    Filed Vitals:   03/10/14 0028 03/10/14 0030 03/10/14 0032 03/10/14 0035  BP:   146/93   Pulse: 107 111 93 91  Temp:      Resp:      Height:      Weight:      SpO2: 96% 100%  99%    Lungs/Cor:  NAD Abdomen:  soft, gravid Ex:  no cords, erythema SVE:  2/90/-2, vtx by nurse FHTs:  140s, good STV, NST R Toco:  q 3-5   A/P   Term PIH with N/V but no other s/s preeclampsia.  For induction at term.  GBS neg.  Kelly [name] A

## 2014-03-10 NOTE — Plan of Care (Signed)
Problem: Consults Goal: Birthing Suites Patient Information Press F2 to bring up selections list  Outcome: Completed/Met Date Met:  03/10/14  Pt 37-[redacted] weeks EGA, Inpatient induction and Other (specify with a note) increased blood pressures

## 2014-03-10 NOTE — Plan of Care (Signed)
Problem: Phase I Progression Outcomes Goal: Assess per MD/Nurse,Routine-VS,FHR,UC,Head to Toe assess Outcome: Completed/Met Date Met:  03/10/14 Goal: OOB as tolerated unless otherwise ordered Outcome: Completed/Met Date Met:  03/10/14 Goal: Medications/IV Fluids N/A Outcome: Completed/Met Date Met:  03/10/14 Goal: Induction meds as ordered Outcome: Completed/Met Date Met:  03/10/14 Goal: Pitocin as ordered Outcome: Completed/Met Date Met:  03/10/14 Goal: Medical plan of care initiated within 2 hrs of admission Outcome: Completed/Met Date Met:  03/10/14  Problem: Phase II Progression Outcomes Goal: Fetal monitoring per orders Outcome: Completed/Met Date Met:  03/10/14 Goal: Empty bladder prn Outcome: Completed/Met Date Met:  03/10/14 Goal: Activity as indicated Outcome: Completed/Met Date Met:  03/10/14 Goal: Clear liquid diet Outcome: Completed/Met Date Met:  03/10/14

## 2014-03-10 NOTE — Anesthesia Procedure Notes (Signed)
Epidural Patient location during procedure: OB Start time: 03/10/2014 11:10 AM  Staffing Anesthesiologist: Loyd Marhefka Performed by: anesthesiologist   Preanesthetic Checklist Completed: patient identified, site marked, surgical consent, pre-op evaluation, timeout performed, IV checked, risks and benefits discussed and monitors and equipment checked  Epidural Patient position: sitting Prep: site prepped and draped and DuraPrep Patient monitoring: continuous pulse ox and blood pressure Approach: midline Location: L3-L4 Injection technique: LOR air  Needle:  Needle type: Tuohy  Needle gauge: 17 G Needle length: 9 cm and 9 Needle insertion depth: 8 cm Catheter type: closed end flexible Catheter size: 19 Gauge Catheter at skin depth: 13 cm Test dose: negative  Assessment Events: blood not aspirated, injection not painful, no injection resistance, negative IV test and no paresthesia  Additional Notes Discussed risk of headache, infection, bleeding, nerve injury and failed or incomplete block.  Patient voices understanding and wishes to proceed.  Epidural placed easily on first attempt.  No paresthesia.  Patient tolerated procedure well with no apparent complications.  Jasmine DecemberA. Kavi Almquist, MDReason for block:procedure for pain

## 2014-03-10 NOTE — Progress Notes (Signed)
Provider notified of tachysystole event and decrease in pitocin dose--provider aware and orders to increase pitocin again if needed

## 2014-03-10 NOTE — Progress Notes (Signed)
Pt moving aroung frequently due to pain--frequent position changes--US adjusted RN remains at bedside will continue to assess

## 2014-03-11 ENCOUNTER — Encounter (HOSPITAL_COMMUNITY): Payer: Self-pay | Admitting: *Deleted

## 2014-03-11 LAB — CBC
HCT: 26.8 % — ABNORMAL LOW (ref 36.0–46.0)
Hemoglobin: 9.5 g/dL — ABNORMAL LOW (ref 12.0–15.0)
MCH: 29.4 pg (ref 26.0–34.0)
MCHC: 35.4 g/dL (ref 30.0–36.0)
MCV: 83 fL (ref 78.0–100.0)
Platelets: 169 10*3/uL (ref 150–400)
RBC: 3.23 MIL/uL — AB (ref 3.87–5.11)
RDW: 14.6 % (ref 11.5–15.5)
WBC: 18.1 10*3/uL — AB (ref 4.0–10.5)

## 2014-03-11 MED ORDER — IBUPROFEN 800 MG PO TABS
800.0000 mg | ORAL_TABLET | Freq: Three times a day (TID) | ORAL | Status: DC
  Administered 2014-03-11 – 2014-03-12 (×6): 800 mg via ORAL
  Filled 2014-03-11 (×7): qty 1

## 2014-03-11 MED ORDER — DIPHENHYDRAMINE HCL 25 MG PO CAPS: 25.0000 mg | ORAL_CAPSULE | Freq: Four times a day (QID) | ORAL | Status: DC | PRN

## 2014-03-11 MED ORDER — GUAIFENESIN 100 MG/5ML PO LIQD
100.0000 mg | ORAL | Status: DC | PRN
  Filled 2014-03-11: qty 10

## 2014-03-11 MED ORDER — METHYLERGONOVINE MALEATE 0.2 MG PO TABS: 0.2000 mg | ORAL_TABLET | ORAL | Status: DC | PRN

## 2014-03-11 MED ORDER — BENZOCAINE-MENTHOL 20-0.5 % EX AERO
1.0000 "application " | INHALATION_SPRAY | CUTANEOUS | Status: DC | PRN
  Administered 2014-03-11: 1 via TOPICAL
  Filled 2014-03-11: qty 56

## 2014-03-11 MED ORDER — SENNOSIDES-DOCUSATE SODIUM 8.6-50 MG PO TABS
2.0000 | ORAL_TABLET | ORAL | Status: DC
  Administered 2014-03-12: 2 via ORAL
  Filled 2014-03-11 (×2): qty 2

## 2014-03-11 MED ORDER — SODIUM CHLORIDE 0.9 % IV SOLN: 250.0000 mL | INTRAVENOUS | Status: DC | PRN

## 2014-03-11 MED ORDER — ONDANSETRON HCL 4 MG/2ML IJ SOLN: 4.0000 mg | INTRAMUSCULAR | Status: DC | PRN

## 2014-03-11 MED ORDER — MAGNESIUM HYDROXIDE 400 MG/5ML PO SUSP: 30.0000 mL | ORAL | Status: DC | PRN

## 2014-03-11 MED ORDER — TETANUS-DIPHTH-ACELL PERTUSSIS 5-2.5-18.5 LF-MCG/0.5 IM SUSP: 0.5000 mL | Freq: Once | INTRAMUSCULAR | Status: DC

## 2014-03-11 MED ORDER — FERROUS SULFATE 325 (65 FE) MG PO TABS
325.0000 mg | ORAL_TABLET | Freq: Two times a day (BID) | ORAL | Status: DC
  Administered 2014-03-11 – 2014-03-13 (×5): 325 mg via ORAL
  Filled 2014-03-11 (×5): qty 1

## 2014-03-11 MED ORDER — MISOPROSTOL 200 MCG PO TABS
800.0000 ug | ORAL_TABLET | Freq: Once | ORAL | Status: AC
  Administered 2014-03-11: 800 ug via RECTAL

## 2014-03-11 MED ORDER — MEASLES, MUMPS & RUBELLA VAC ~~LOC~~ INJ
0.5000 mL | INJECTION | Freq: Once | SUBCUTANEOUS | Status: DC
  Filled 2014-03-11: qty 0.5

## 2014-03-11 MED ORDER — OXYCODONE-ACETAMINOPHEN 5-325 MG PO TABS
2.0000 | ORAL_TABLET | ORAL | Status: DC | PRN
  Administered 2014-03-13: 2 via ORAL
  Filled 2014-03-11: qty 2

## 2014-03-11 MED ORDER — METHYLERGONOVINE MALEATE 0.2 MG/ML IJ SOLN: 0.2000 mg | INTRAMUSCULAR | Status: DC | PRN

## 2014-03-11 MED ORDER — DIBUCAINE 1 % RE OINT: 1.0000 "application " | TOPICAL_OINTMENT | RECTAL | Status: DC | PRN

## 2014-03-11 MED ORDER — OXYCODONE-ACETAMINOPHEN 5-325 MG PO TABS
1.0000 | ORAL_TABLET | ORAL | Status: DC | PRN
Start: 2014-03-11 — End: 2014-03-13
  Administered 2014-03-11 (×2): 1 via ORAL
  Filled 2014-03-11 (×2): qty 1

## 2014-03-11 MED ORDER — MISOPROSTOL 200 MCG PO TABS
ORAL_TABLET | ORAL | Status: AC
  Filled 2014-03-11: qty 4

## 2014-03-11 MED ORDER — PRENATAL MULTIVITAMIN CH
1.0000 | ORAL_TABLET | Freq: Every day | ORAL | Status: DC
  Administered 2014-03-11 – 2014-03-12 (×2): 1 via ORAL
  Filled 2014-03-11 (×2): qty 1

## 2014-03-11 MED ORDER — LANOLIN HYDROUS EX OINT: TOPICAL_OINTMENT | CUTANEOUS | Status: DC | PRN

## 2014-03-11 MED ORDER — SODIUM CHLORIDE 0.9 % IJ SOLN: 3.0000 mL | Freq: Two times a day (BID) | INTRAMUSCULAR | Status: DC

## 2014-03-11 MED ORDER — SODIUM CHLORIDE 0.9 % IJ SOLN
3.0000 mL | INTRAMUSCULAR | Status: DC | PRN
  Administered 2014-03-11: 3 mL via INTRAVENOUS
  Filled 2014-03-11: qty 3

## 2014-03-11 MED ORDER — WITCH HAZEL-GLYCERIN EX PADS: 1.0000 "application " | MEDICATED_PAD | CUTANEOUS | Status: DC | PRN

## 2014-03-11 MED ORDER — SIMETHICONE 80 MG PO CHEW: 80.0000 mg | CHEWABLE_TABLET | ORAL | Status: DC | PRN

## 2014-03-11 MED ORDER — ZOLPIDEM TARTRATE 5 MG PO TABS: 5.0000 mg | ORAL_TABLET | Freq: Every evening | ORAL | Status: DC | PRN

## 2014-03-11 MED ORDER — ONDANSETRON HCL 4 MG PO TABS: 4.0000 mg | ORAL_TABLET | ORAL | Status: DC | PRN

## 2014-03-11 NOTE — Lactation Note (Signed)
This note was copied from the chart of Kelly [name]. Lactation Consultation Note  Patient Name: Kelly Mickel CrowKalisha [name] WUJWJ'XToday's Date: 03/11/2014 Reason for consult: Initial assessment Baby 16 hours of life. Patient's RN Brook asked for assistance with latching reporting that baby isn't suckling well. Mom states that she has attempted to nurse several times but baby will not maintain a latch. Discussed positioning with mom in order to avoid hurting the baby due to right shoulder dystocia. Wrapped baby in blanket so as to support right arm securely against baby's body and yet open on left side and part of chest for STS while nursing. Assisted mom to latch baby in football position to left breast. Baby cueing to nurse but doesn't have a coordinated suck. Baby's sucking improved gradually when stimulated with LC's gloved finger. Mom able to readily express drops of colostrum from left breast which were given to baby. Enc mom to offer lots of STS and attempts at breast in football position on left breast and cross-cradle on right breast in order to avoid causing baby right should pain. Also enc mom to let baby suckle on finger to enc continued improvement with suckling. Enc mom to consider pumping starting either tonight or first thing in the morning in order to protect her supply depending on how well baby picks up with nursing in response to STS and suck training with mom's finger. Discussed assessment, interventions, and plan with patient's RN Brook.  Maternal Data Has patient been taught Hand Expression?: Yes Does the patient have breastfeeding experience prior to this delivery?: No  Feeding Feeding Type: Breast Fed Length of feed: 0 min  LATCH Score/Interventions Latch: Too sleepy or reluctant, no latch achieved, no sucking elicited. Intervention(s): Skin to skin Intervention(s): Adjust position;Assist with latch;Breast compression  Audible Swallowing: None  Type of Nipple:  Everted at rest and after stimulation  Comfort (Breast/Nipple): Soft / non-tender     Hold (Positioning): Assistance needed to correctly position infant at breast and maintain latch. Intervention(s): Breastfeeding basics reviewed;Support Pillows;Position options  LATCH Score: 5  Lactation Tools Discussed/Used     Consult Status Consult Status: Follow-up Date: 03/12/14 Follow-up type: In-patient    Geralynn OchsWILLIARD, Loetta Connelley 03/11/2014, 4:43 PM

## 2014-03-11 NOTE — Plan of Care (Signed)
Problem: Phase II Progression Outcomes Goal: Pain controlled on oral analgesia Outcome: Completed/Met Date Met:  03/11/14 Goal: Progress activity as tolerated unless otherwise ordered Outcome: Completed/Met Date Met:  03/11/14

## 2014-03-11 NOTE — Plan of Care (Signed)
Problem: Consults Goal: Postpartum Patient Education (See Patient Education module for education specifics.) Outcome: Completed/Met Date Met:  03/11/14  Problem: Phase I Progression Outcomes Goal: Pain controlled with appropriate interventions Outcome: Completed/Met Date Met:  03/11/14 Goal: OOB as tolerated unless otherwise ordered Outcome: Completed/Met Date Met:  03/11/14 Goal: Initial discharge plan identified Outcome: Completed/Met Date Met:  03/11/14

## 2014-03-11 NOTE — Progress Notes (Signed)
No complaints.  She is ambulating, voiding, tolerating PO.  Pain control is good.  Lochia is appropriate Significant tachycardia with delivery yesterday.  HR 150-180 during delivery (per delivery note, pt with panic attack during crowning). EBL was 300 cc Pulse overnight has improved to the 120-130s.  She denies any complaints.  No CP, SOB, lightheadedness.  Has ambulated without difficulty. No RUQ pain, HA, BV   Filed Vitals:   03/11/14 0200 03/11/14 0230 03/11/14 0330 03/11/14 0430  BP: 134/74 148/61 149/93 148/92  Pulse: 127 135 130 129  Temp:   98.6 F (37 C) 98.7 F (37.1 C)  TempSrc:   Oral Oral  Resp:   18 18  Height:      Weight:      SpO2:   100% 100%    NAD CTAB, no crackles Reg rhythm, but tachy No RUQ tenderness Fundus firm Ext: 2+ edema bilaterally, 3+DTRs, no clonus  Lab Results  Component Value Date   WBC 18.1* 03/11/2014   HGB 9.5* 03/11/2014   HCT 26.8* 03/11/2014   MCV 83.0 03/11/2014   PLT 169 03/11/2014   EKG: sinus tachycardia  --/--/O POS (12/05 0105)/RImmune  A/P 23 y.o. [id number] PPD#0 s/p VAVD 2/2 maternal exhaustion. Routine care.   Preeclampsia: no severe features.  BPs mild range today 130-140/90s. .  Tachycardia: hgb 9.5 this AM.  Asymptomatic.  Lochia appropriate EKG sinus tachycardia.  As tachycardia is slowly improving, will cont to monitor.     EwenLARK, B[facility] Surgery [facility] LLCDYANNA

## 2014-03-11 NOTE — Anesthesia Postprocedure Evaluation (Signed)
Anesthesia Post Note  Patient: Kelly [name]  Procedure(s) Performed: * No procedures listed *  Anesthesia type: Epidural  Patient location: Mother/Baby  Post pain: Pain level controlled  Post assessment: Post-op Vital signs reviewed  Last Vitals:  Filed Vitals:   03/11/14 0700  BP: 161/90  Pulse: 115  Temp: 37.1 C  Resp: 20    Post vital signs: Reviewed  Level of consciousness:alert  Complications: No apparent anesthesia complications

## 2014-03-11 NOTE — Progress Notes (Signed)
Vital signs taken at 0830.

## 2014-03-11 NOTE — Plan of Care (Signed)
Problem: Phase I Progression Outcomes Goal: Other Phase I Outcomes/Goals Outcome: Not Applicable Date Met:  58/52/77

## 2014-03-11 NOTE — Plan of Care (Signed)
Problem: Phase I Progression Outcomes Goal: Voiding adequately Outcome: Completed/Met Date Met:  03/11/14

## 2014-03-11 NOTE — Plan of Care (Signed)
Problem: Phase I Progression Outcomes Goal: VS, stable, temp < 100.4 degrees F Outcome: Completed/Met Date Met:  03/11/14  Problem: Phase II Progression Outcomes Goal: Afebrile, VS remain stable Outcome: Completed/Met Date Met:  03/11/14 Goal: Tolerating diet Outcome: Completed/Met Date Met:  03/11/14 Goal: Other Phase II Outcomes/Goals Outcome: Not Applicable Date Met:  32/02/33

## 2014-03-12 NOTE — Plan of Care (Signed)
Problem: Discharge Progression Outcomes Goal: Tolerating diet Outcome: Completed/Met Date Met:  03/12/14     

## 2014-03-12 NOTE — Lactation Note (Signed)
This note was copied from the chart of Kelly Siarah [name]. Lactation Consultation Note Mom hasn't been able to maintain a good latch. Baby has been spitty and has shoulder Rt. dystocia so feeding was to Rt. Shoulder upwards. Baby unable to obtain a deep latch d/t short shaft nipples and compresses inwards. Moms breast are filling. Dripping colostrum. Hand expression taught.  Mom fitted w/#20 NS. Baby wouldn't latch onto NS. W/curve tip syring while on NS put colostrum in baby's mouth to suckle but was resistant. W/gloved finger gave 64mlSouthwest 773-HiLLCr1610RandWiD13Grace Hospi649Mau22sInniKindred Hospital - De45563-[place] SurRanD13[facility] 80C20mntTexas Health Harris Methodist (845)Rehabilitation Institute OHerndon Surge1610RandWiD13[facility] Cen5421Da45lHDrug Rehabilitation Incorporated - Day 27mneLemue912-Bayside CommunityMemori1610RandWiD13[facility] Cen49Brow80nsv48iAkron Children'S65mHoDoctors Uni801-Surgicare CSout1610RandWiD13[facility] 55Chat57tan28oStatesProvidence St Vincent M44mdiThe972 Hoag 1610RandWiD13[facility] Cen48North Fa11ir 18OToHudson Su50mgiDod(346) Ent Surgery Center Of AuFranciscan St1610RandWiD13[facility] Cen74India11nap26oConcMount Sinai Hospital - Mount Sinai Hospi68malFleming Isl276-O[place] CommunityLown1610RandWiD13Medical Center Barb73Fores5t G77rLAtlantic Gastroenterol32mgyFleming Isl867-[place] G1610RandWiD13Pine[facility]st Rehab Hos94pi963LOak Methodist Hospit70ml [facility] Dba Precence(630) Stat SpecialtyWes1610RandWiD13[facility] Al6376Ach52iShanksPark City M54mdiDigest(905)[facility] IndAssoc1610RandWiD13[facility] Cen39M21eta24mStansber43myDMunson Healthcare C302-[place] [facility]1610RandWiD13[facility] Cha7854Oak8Great Lakes Eye Surge17my First Bapt(512)[place] U1610RandWiD13Contin[facility] Of Tu22Seve12n H5iEagleSelect Specialty Hospital - Pho48mniCommon(910)A[facility] F1610RandWiD13[facility] 4920New65tStonNorthridge M68mdiFoundation Surgical Hospi941-[place] EVa1610RandWiD13Va [facility] Sys2718A20rPGordon Memorial Hosp95mtaVanderbilt U95Brentwood Surgery 1610RandWiD13Harmon Memorial Hospi29Co47vin91gWolvUnity M26mdiHenrico(865)Franconiaspringfield Surgery CEastern Penn1610RandWiD13Ambulatory Surgical Center 3040Ant60hFreSt Simons By-The34mSeSt717-Twelve-Step Living Corporation - [place] Recov1610RandWiD13Center [facility] Surgery4Chickama61w B74eSeabrook Hacienda Children'S 47mosVa Pittsburgh Healthcar314-Sebasti1610RandWiD13[place] [facility] Hospi44Cerr80o G56oSmitWest Flo31midGrossnic(562)Shor1610RandWiD13[facility] Wa2876Si52dSugarloaf ViSanta Ynez Valley Cot79magPleasantdale A(815)[facility] MedicC1610RandWiD13[facility] 36Gast46onv16Barnes-Kasson Co55mntConnecticut Orthopae(317)Gastrointestinal Specialists Of Cla1610RandWiD13Intr[facility] Hospi664Ha63hInchNorth Country Hospital & 61meaNoland Hospit(870)[place] Children'[facility] - Eye Care And Surger1610RandWiD13Guthrie Corning Hospi32Fores19t R35iSummersSt. Elizabeth M68mdiTexas Health Harris Methodist Hos863-B1610RandWiD13[facility] C37Te35hua4cHitcPlano Ambulatory Surgery 34mssKindred Hospital Hous62Frank[facility] Endoscopy CBanner1610RandWiD13Palmet[facility] General Hosp12i7176WCedar Desert Regional M3mdiPerry571-[place] V1610RandWiD13[facility] Va Medical Cen54Nik75ola68ePoint PlePhysicians Day Surgery Ctrpreatment Center. Baby sleepy at this time. Mom anxious when baby cries at breast. Encouraged to take deep breaths and relax, talk to baby, and express colostrum into NS. Since baby wouldn't BF, hand expressed 60 ml colostrum from both breast to relieve discomfort. Hand pump given and unable to get anything. Shells given to wear between BF in am. To assist in everting nipples. Mom doesn't like the NS because the baby doesn't like it. Encouraged mom to call LC for next feeding and I would assist in latching. Patient Name: Kelly [name] Today's Date: 03/12/2014 Reason for consult: Follow-up assessment;Difficult latch   Maternal Data    Feeding Feeding Type: Breast Fed Length of feed: 5 min (popping off and on)  LATCH Score/Interventions Latch: Repeated attempts needed to sustain latch, nipple held in mouth throughout feeding, stimulation needed to elicit sucking reflex. Intervention(s): Skin to skin;Teach feeding cues;Waking techniques Intervention(s): Adjust position;Assist with latch;Breast massage;Breast compression  Audible Swallowing: A few with stimulation Intervention(s): Skin to skin;Hand expression Intervention(s): Alternate breast massage  Type of Nipple: Everted at rest and after stimulation (compresses inwards)  Comfort (Breast/Nipple): Soft / non-tender     Hold (Positioning): Full assist, staff holds infant at  breast Intervention(s): Breastfeeding basics reviewed;Support Pillows;Position options;Skin to skin  LATCH Score: 6  Lactation Tools Discussed/Used Tools: Shells;Nipple Shields;Pump;Feeding cup Nipple shield size: 20 Shell Type: Inverted Breast pump type: Manual Pump Review: Milk Storage Initiated by:: L. Olive Motyka RN Date initiated:: 03/12/14   Consult Status Consult Status: Follow-up Date: 03/12/14 Follow-up type: In-patient    Damyen Knoll G 03/12/2014, 2:27 AM

## 2014-03-12 NOTE — Progress Notes (Signed)
PPD#1 Pt has no complaints. She is slightly hypertensive and tachy. She has no symptoms. IMP/ Stable  Plan/ Will not tx at this time since asymptomatic. Will follow.

## 2014-03-12 NOTE — Lactation Note (Signed)
This note was copied from the chart of Kelly Byanca [name]. Lactation Consultation Note     Follow up consult with this mom and baby, now 4237 hours old, baby term and was at 5% weight loss at 24 hours of age. When I entered the room, the baby was asleep in mom's arms, and she said she just finished feeding the baby. I asked if her nipple was pinched or uncomfortable during the feeding, she said no, but she said it appeared pinched after the feeding. Mom had about 40 mls of old colostrum that had to be discarded, that she had hand expressed with an LC  during the night.  I advised mom to call for lactation with baby's next feeding, so her latch could be observed, and if hand expression is done, we can show her how to use the foley cup to feed her baby.  The baby's has had multiple stools (7), and they are already transitioned to green, and 3 voids. The baby is probably transferring well, but a latch needs to be seen.   Patient Name: Kelly [name] ZOXWR'UToday's Date: 03/12/2014     Maternal Data    Feeding Feeding Type: Breast Fed  LATCH Score/Interventions                      Lactation Tools Discussed/Used     Consult Status      Kelly [name], Kelly [name] 03/12/2014, 1:57 PM

## 2014-03-13 MED ORDER — IBUPROFEN 800 MG PO TABS: 800.0000 mg | ORAL_TABLET | Freq: Three times a day (TID) | ORAL | Status: DC | PRN

## 2014-03-13 NOTE — Lactation Note (Addendum)
This note was copied from the chart of Kelly Litzy Hayes-Andrews. Lactation Consultation Note  Patient Name: Kelly [name] ZRVUF'C Date: 03/13/2014 Reason for consult: Follow-up assessment;Other (Comment) (per mom pumping and bottle feeding )  Baby is 54 hours old and per mom pumping and bottle feeding. Per mom I find it easier to pump and with hand pump and hand express. Per mom I don't get any milk with the DEBP, and only have pumped once in the last 24 hours. LC reviewed supply and demand, recommended to mom to call [place] this am , and LC would Fax a [facility] loaner pump referral, if [facility] did Not answer phone or call Mom back by [phone], to page this [place]. LC reviewed sore nipple and engorgement prevention and tx. Mom denies nipple soreness. Mother informed of post-discharge support and given phone number to the lactation department, including services for phone call assistance; out-patient appointments; and breastfeeding support group. List of other breastfeeding resources in the community given in the handout. Encouraged mother to call for problems or concerns related to breastfeeding.  Mom did not call LC back prior to D/C . [facility] rep called LC back and had a DEBP available , but the mom was D/C and the pump kit for set up was left in room. WIC rep mentioned she had called the room and cell phone and no answer.    Maternal Data    Feeding Feeding Type: Formula  LATCH Score/Interventions                Intervention(s): Breastfeeding basics reviewed     Lactation Tools Discussed/Used WIC Program: Yes [facility] loaner pump referral faxed )   Consult Status Consult Status: Follow-up Date: 03/13/14 Follow-up type: In-patient    Myer Haff 03/13/2014, 9:33 AM

## 2014-03-13 NOTE — Progress Notes (Signed)
Post Partum Day 2 Subjective: no complaints, up ad lib, voiding, tolerating PO, + flatus and having some difficulty breast feeding  Objective: Blood pressure 139/69, pulse 105, temperature 98.6 F (37 C), temperature source Oral, resp. rate 18, height 5' (1.524 m), weight 99.338 kg (219 lb), last menstrual period 06/09/2013, SpO2 99 %, unknown if currently breastfeeding.  Physical Exam:  General: alert, cooperative and no distress Lochia: appropriate Uterine Fundus: firm perineum: healing well DVT Evaluation: No evidence of DVT seen on physical exam. Negative Homan's sign. No cords or calf tenderness. No significant calf/ankle edema.   Recent Labs  03/10/14 1023 03/11/14 0550  HGB 11.6* 9.5*  HCT 32.6* 26.8*    Assessment/Plan: Discharge home, Breastfeeding, Lactation consult and Contraception will discuss at post partum visit   LOS: 4 days   Kelly [name] Kelly [name] 03/13/2014, 8:18 AM

## 2014-03-13 NOTE — Discharge Summary (Signed)
Obstetric Discharge Summary Reason for Admission: induction of labor Prenatal Procedures: NST Intrapartum Procedures: vacuum Postpartum Procedures: none Complications-Operative and Postpartum: 2nd degree perineal laceration HEMOGLOBIN  Date Value Ref Range Status  03/11/2014 9.5* 12.0 - 15.0 g/dL Final    Comment:    REPEATED TO VERIFY DELTA CHECK NOTED    HCT  Date Value Ref Range Status  03/11/2014 26.8* 36.0 - 46.0 % Final    Physical Exam:  General: alert, cooperative and no distress Lochia: appropriate Uterine Fundus: firm perineum: healing well DVT Evaluation: No evidence of DVT seen on physical exam. Negative Homan's sign. No cords or calf tenderness. No significant calf/ankle edema  Discharge Diagnoses: Term Pregnancy-delivered  Discharge Information: Date: 03/13/2014 Activity: pelvic rest Diet: routine Medications: PNV and Ibuprofen Condition: stable Instructions: refer to practice specific booklet Discharge to: home   Newborn Data: Live born female  Birth Weight: 8 lb 4.6 oz (3759 g) APGAR: 2, 8  Home with mother.  Kelly HartINN, Kelly [name] [name] 03/13/2014, 8:21 AM

## 2014-11-26 ENCOUNTER — Emergency Department (HOSPITAL_COMMUNITY)
Admission: EM | Admit: 2014-11-26 | Discharge: 2014-11-26 | Disposition: A | Discharge status: Home / Self Care | Payer: Medicaid Other | Attending: Emergency Medicine | Admitting: Emergency Medicine

## 2014-11-26 ENCOUNTER — Emergency Department (HOSPITAL_COMMUNITY): Payer: Medicaid Other

## 2014-11-26 ENCOUNTER — Encounter (HOSPITAL_COMMUNITY): Payer: Self-pay | Admitting: *Deleted

## 2014-11-26 DIAGNOSIS — M25561 Pain in right knee: Secondary | ICD-10-CM | POA: Insufficient documentation

## 2014-11-26 DIAGNOSIS — R2241 Localized swelling, mass and lump, right lower limb: Secondary | ICD-10-CM | POA: Insufficient documentation

## 2014-11-26 NOTE — ED Notes (Signed)
Pt reports injuring rt knee while working out last week. Pt reports pain and swelling to knee.

## 2014-11-26 NOTE — ED Provider Notes (Signed)
CSN: [id number]     Arrival date & time 11/26/14  1634 History  This chart was scribed for Kelly Mechanic, PA-C, working with Lorre Nick, MD by Elon Spanner, ED Scribe. This patient was seen in room TR10C/TR10C and the patient's care was started at 5:00 PM.   Chief Complaint  Patient presents with  . Knee Injury   The history is provided by the patient. No language interpreter was used.   HPI Comments: Kelly [name] is a 24 y.o. female who presents to the Emergency Department complaining of constant, unchanged pain to the medial aspect of the right knee pain with minimal swelling onset 9 days ago.  Pain is worse with ambulation and improved with rest.  The patient reports working out with a trainer the day prior to onset but is unsure of an exact mechanism of injury.  There is no history of prior injury.  Aleve with transient relief.  No distal extremity loss of sensation.   Past Medical History  Diagnosis Date  . Medical history non-contributory    Past Surgical History  Procedure Laterality Date  . Induced abortion    . No past surgeries     Family History  Problem Relation Age of Onset  . Cancer Paternal Grandmother     grandma  . Hearing loss Neg Hx    Social History  Substance Use Topics  . Smoking status: Never Smoker   . Smokeless tobacco: None  . Alcohol Use: No   OB History    Gravida Para Term Preterm AB TAB SAB Ectopic Multiple Living   3 1 1  0 2 1 1  0 0 1     Review of Systems  All other systems reviewed and are negative.     Allergies  Review of patient's allergies indicates no known allergies.  Home Medications   Prior to Admission medications   Medication Sig Start Date End Date Taking? Authorizing Provider  acetaminophen (TYLENOL) 500 MG tablet Take 1 tablet (500 mg total) by mouth every 6 (six) hours as needed. 11/22/13   Tiffany Neva Seat, PA-C  ibuprofen (ADVIL,MOTRIN) 800 MG tablet Take 1 tablet (800 mg total) by mouth every 8  (eight) hours as needed. 03/13/14   Essie Hart, MD   BP 142/84 mmHg  Pulse 75  Temp(Src) 98.4 F (36.9 C) (Oral)  Resp 14  SpO2 96%  LMP 10/27/2014 Physical Exam  Constitutional: She is oriented to person, place, and time. She appears well-developed and well-nourished. No distress.  HENT:  Head: Normocephalic and atraumatic.  Eyes: Conjunctivae and EOM are normal.  Neck: Neck supple. No tracheal deviation present.  Cardiovascular: Normal rate.   Pulmonary/Chest: Effort normal. No respiratory distress.  Musculoskeletal: Normal range of motion.  Knees equal bilateral.  No signs of obvious trauma, swelling or deformity.  No TTP at medial aspect.  Lachman's negative, posterior drawer negative, valgus varus normal, patellar grind normal, Mcmurray negative.  Distal sensation strength and perfusion intact.  Cap refill < 3 seconds.     Neurological: She is alert and oriented to person, place, and time.  Skin: Skin is warm and dry.  Psychiatric: She has a normal mood and affect. Her behavior is normal.  Nursing note and vitals reviewed.   ED Course  Procedures (including critical care time)  DIAGNOSTIC STUDIES: Oxygen Saturation is 96% on RA, normal by my interpretation.    COORDINATION OF CARE:  5:05 PM Discussed treatment plan with patient at bedside.  Patient acknowledges and agrees  with plan.    Labs Review Labs Reviewed - No data to display  Imaging Review Dg Knee Complete 4 Views Right  11/26/2014   CLINICAL DATA:  Right knee pain  EXAM: RIGHT KNEE - COMPLETE 4+ VIEW  COMPARISON:  None.  FINDINGS: Four views of the right knee submitted. No acute fracture or subluxation. No radiopaque foreign body. No joint effusion.  IMPRESSION: Negative.   Electronically Signed   By: Natasha Mead M.D.   On: 11/26/2014 16:57   I have personally reviewed and evaluated these images and lab results as part of my medical decision-making.   EKG Interpretation None      MDM   Final diagnoses:   Right knee pain      Labs:  Imaging: DG Right Knee Complete 4 views  Therapeutics:  Assessment/Plan: no significant findings on exam. Pt encouraged to follow-up with PCP for further eval. Pt requesting time off of work. She will not be given leave from work with no findings on exam. Symptomatic care instructions given.   Discharge Meds:   I personally performed the services described in this documentation, which was scribed in my presence. The recorded information has been reviewed and is accurate.   Kelly Mechanic, PA-C 11/28/14 1430  Vanetta Mulders, MD 11/30/14 [phone]

## 2014-11-26 NOTE — Discharge Instructions (Signed)
Please use Tylenol, ibuprofen, ice as needed for pain. Please follow-up with primary care provider for further evaluation and management.

## 2014-11-26 NOTE — ED Notes (Signed)
Pt is in stable condition upon d/c and ambulates from ED. 

## 2015-11-23 IMAGING — DX DG KNEE COMPLETE 4+V*R*
4 series · 4 of 4 positions shown · non-contrast
Comparison: None.

CLINICAL DATA: Right knee pain

EXAM:
RIGHT KNEE - COMPLETE 4+ VIEW

[t knee ap right]
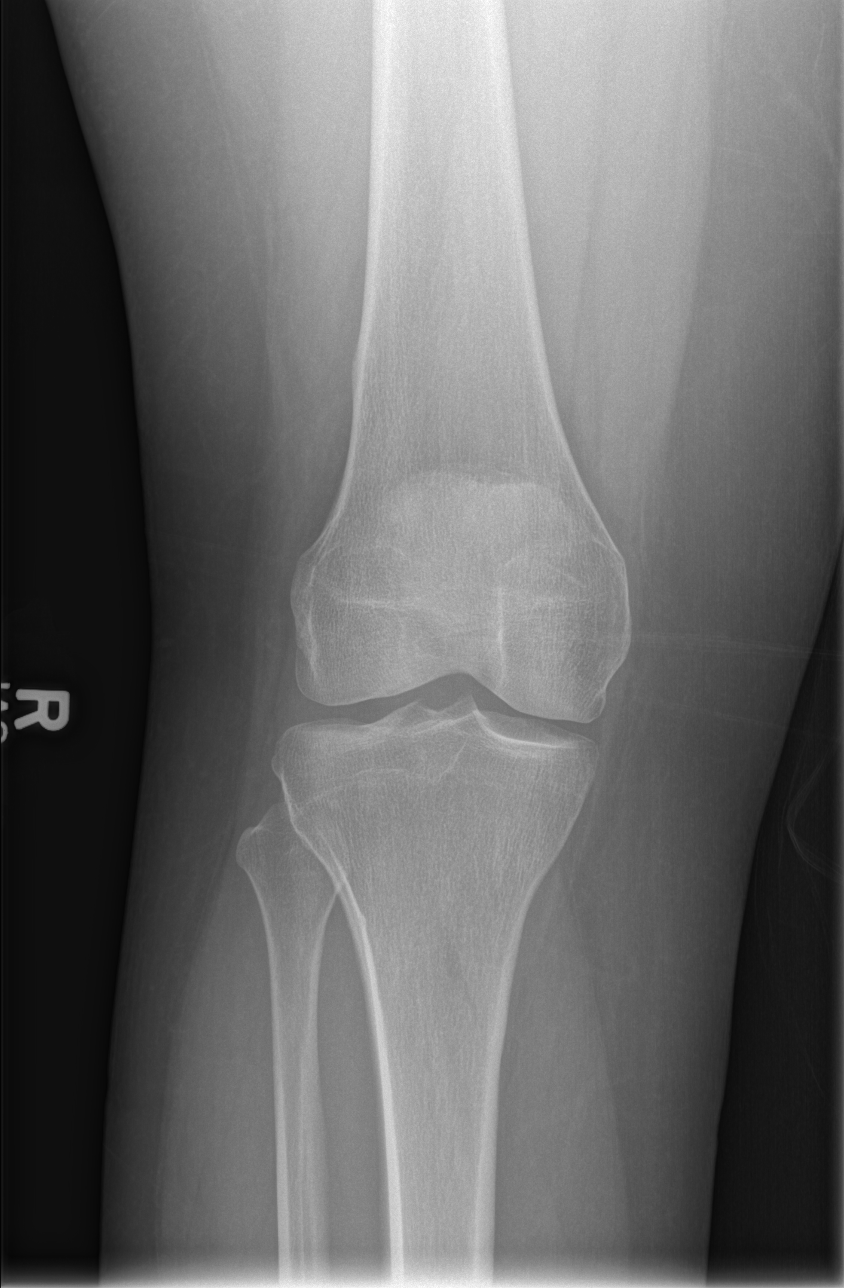

[t knee obl right]
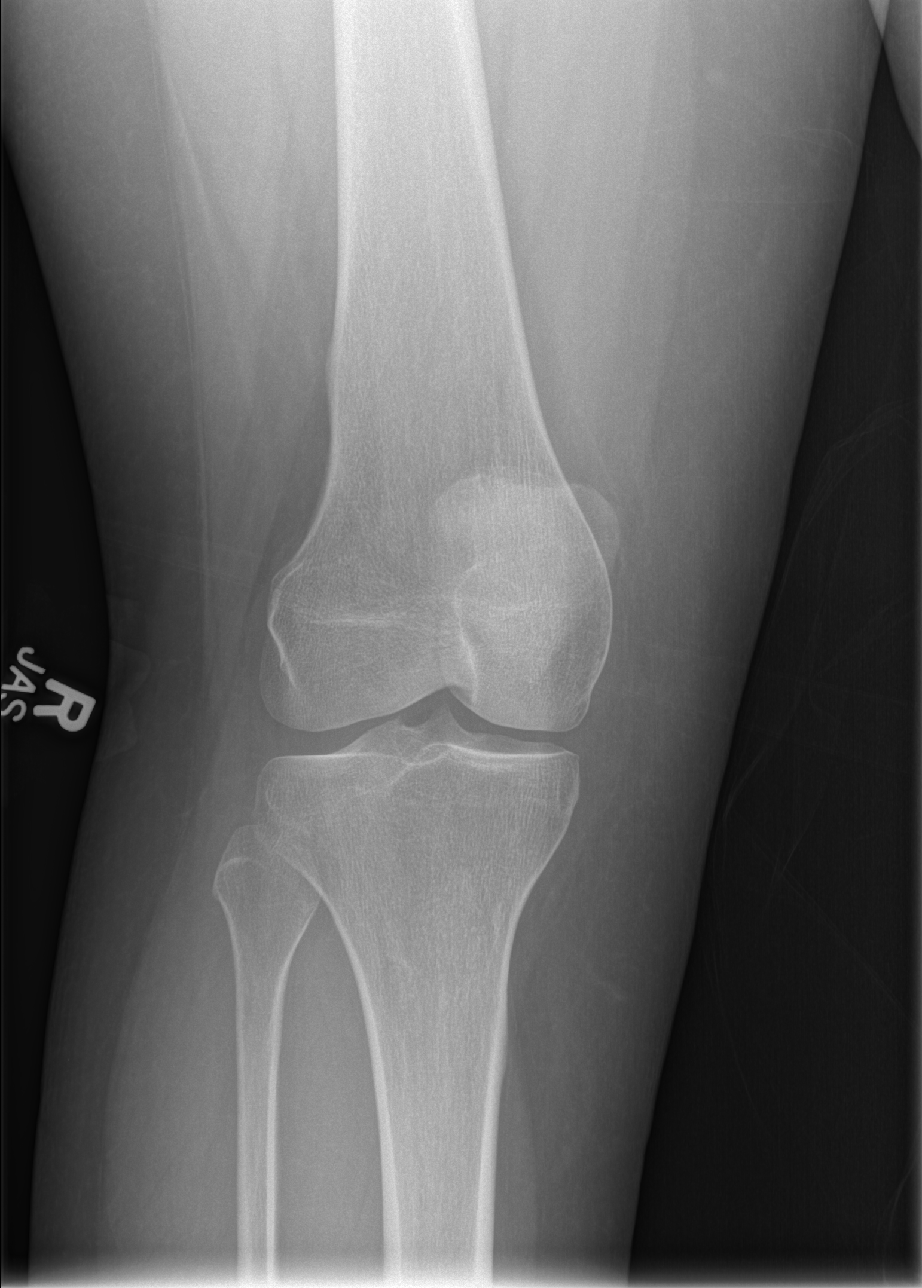

[t knee lat right (1 of 2)]
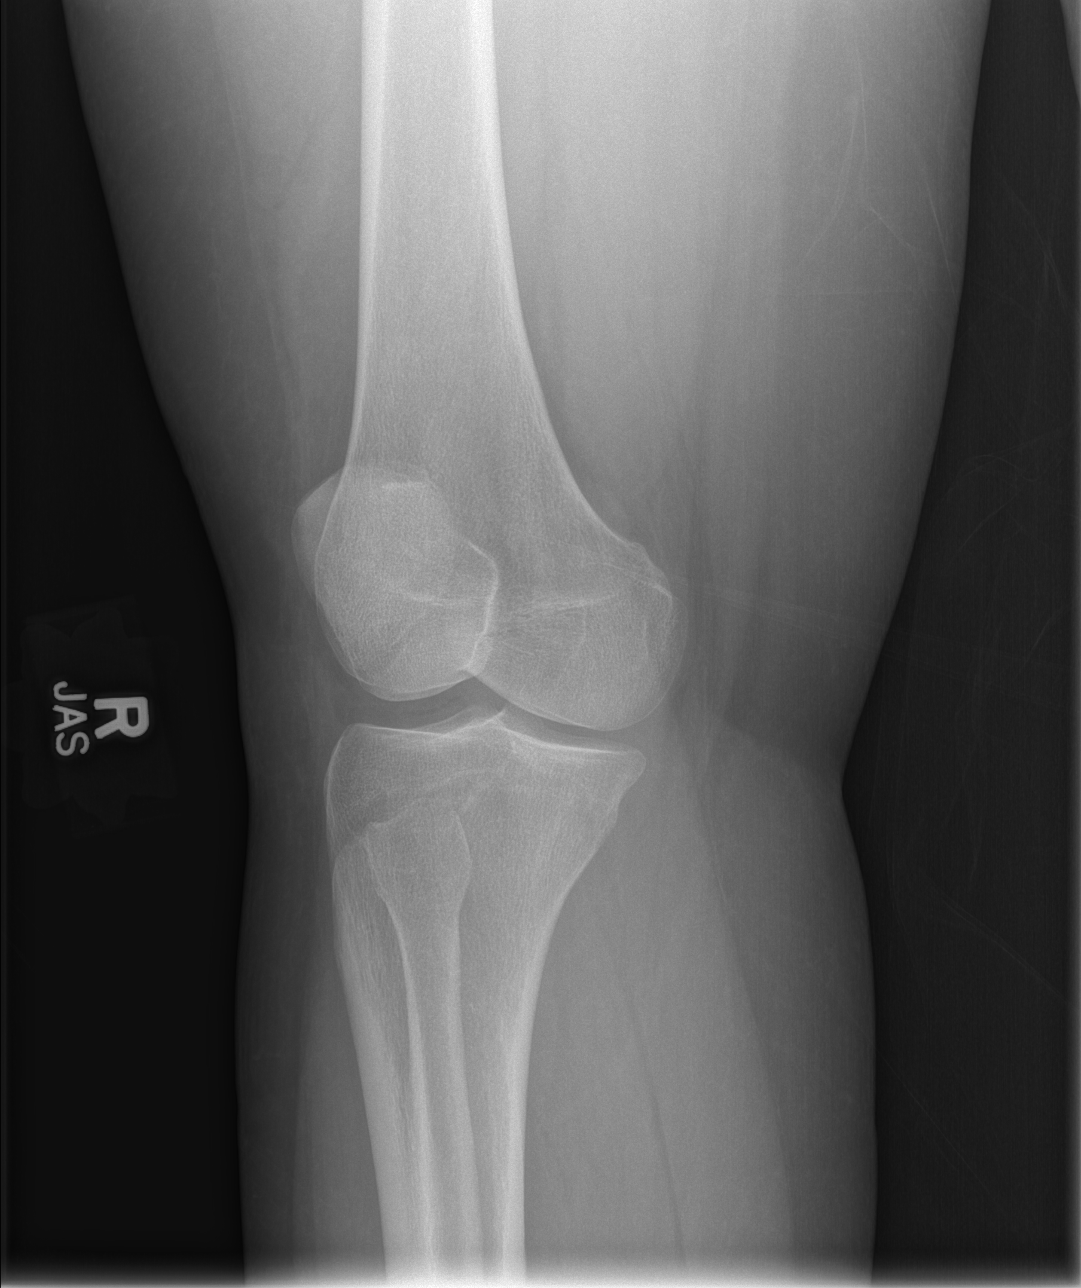

[t knee lat right (2 of 2)]
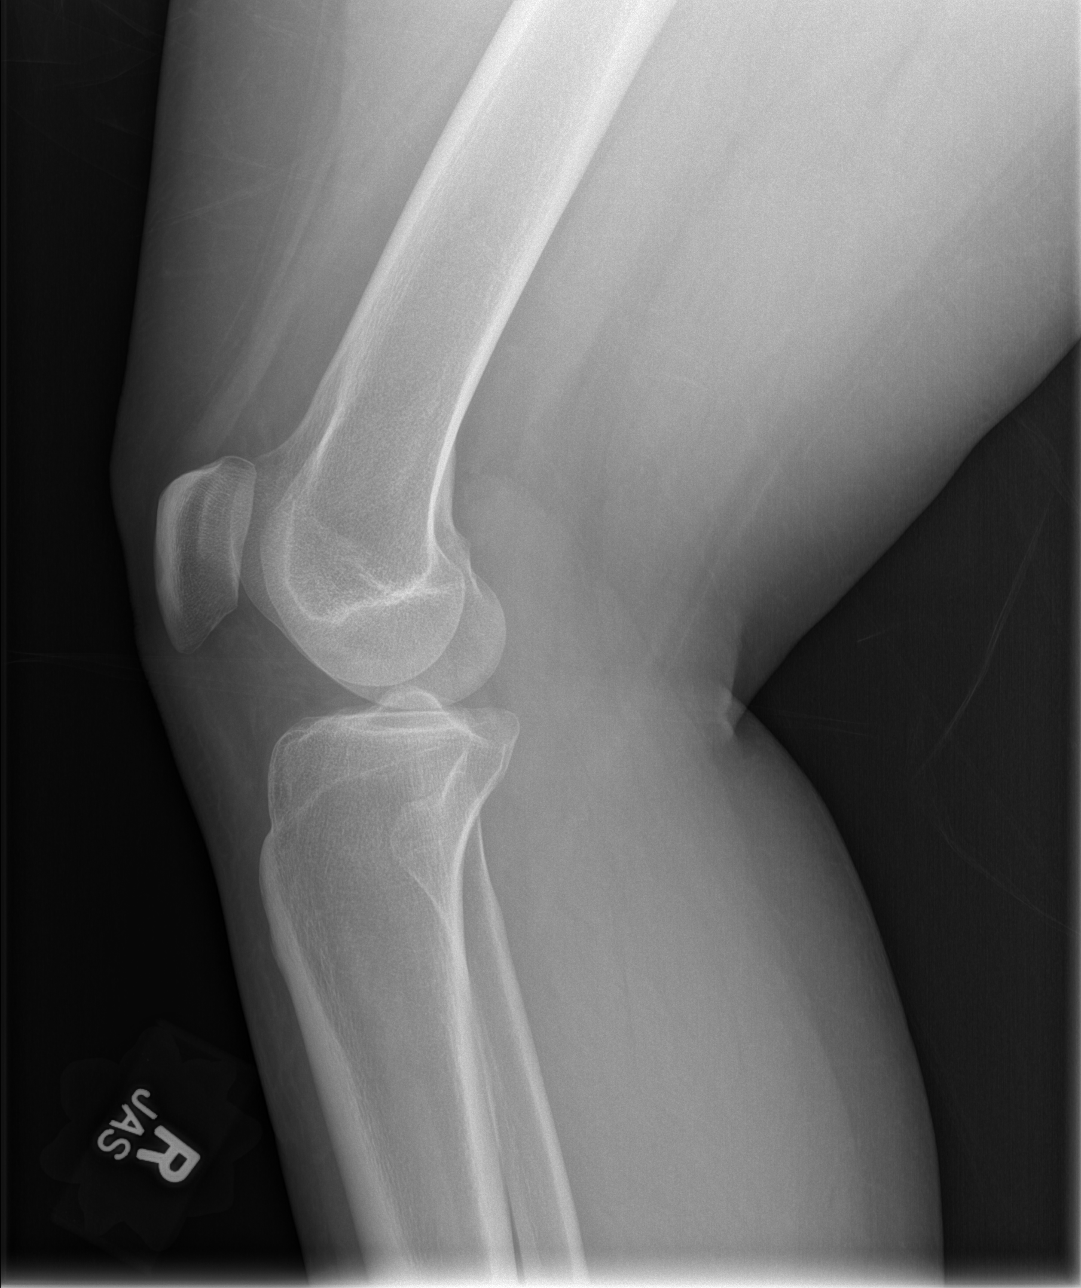

[4 of 4 positions shown; findings below may reference images not displayed]

FINDINGS: Four views of the right knee submitted. No acute fracture or
subluxation. No radiopaque foreign body. No joint effusion.
IMPRESSION: Negative.

## 2015-12-20 ENCOUNTER — Emergency Department (HOSPITAL_COMMUNITY)
Admission: EM | Admit: 2015-12-20 | Discharge: 2015-12-20 | Disposition: A | Discharge status: Home / Self Care | Payer: Medicaid Other | Attending: Emergency Medicine | Admitting: Emergency Medicine

## 2015-12-20 ENCOUNTER — Encounter (HOSPITAL_COMMUNITY): Payer: Self-pay | Admitting: Emergency Medicine

## 2015-12-20 DIAGNOSIS — W260XXA Contact with knife, initial encounter: Secondary | ICD-10-CM | POA: Insufficient documentation

## 2015-12-20 DIAGNOSIS — Y990 Civilian activity done for income or pay: Secondary | ICD-10-CM | POA: Insufficient documentation

## 2015-12-20 DIAGNOSIS — Y939 Activity, unspecified: Secondary | ICD-10-CM | POA: Insufficient documentation

## 2015-12-20 DIAGNOSIS — Y929 Unspecified place or not applicable: Secondary | ICD-10-CM | POA: Insufficient documentation

## 2015-12-20 DIAGNOSIS — S61412A Laceration without foreign body of left hand, initial encounter: Secondary | ICD-10-CM | POA: Insufficient documentation

## 2015-12-20 MED ORDER — ACETAMINOPHEN-CODEINE #3 300-30 MG PO TABS: 1.0000 | ORAL_TABLET | Freq: Four times a day (QID) | ORAL | 0 refills | Status: DC | PRN

## 2015-12-20 MED ORDER — LIDOCAINE-EPINEPHRINE (PF) 2 %-1:200000 IJ SOLN
10.0000 mL | Freq: Once | INTRAMUSCULAR | Status: AC
  Administered 2015-12-20: 10 mL via INTRADERMAL
  Filled 2015-12-20: qty 20

## 2015-12-20 NOTE — ED Notes (Signed)
Pt's hand placed to soak

## 2015-12-20 NOTE — ED Triage Notes (Signed)
Reports cut it on a box cutter. Reports no pain/discomfort

## 2015-12-20 NOTE — ED Provider Notes (Signed)
[facility]-EMERGENCY DEPT Provider Note   CSN: [id number] Arrival date & time: 12/20/15  1854  By signing my name below, I, Linna DarnerRussell Turner, attest that this documentation has been prepared under the direction and in the presence of Fayrene HelperBowie Tiyon Sanor, PA-C . Electronically Signed: Linna Darnerussell Turner, Scribe. 12/20/2015. 7:36 PM.  History   Chief Complaint Chief Complaint  Patient presents with  . Hand Injury    The history is provided by the patient. No language interpreter was used.     HPI Comments: Kelly [name] is a 25 y.o. female who presents to the Emergency Department complaining of left palm laceration sustained about 8 hours ago. Pt reports she was at work and cut herself with a box cutter. She states she stayed at work afterwards. Pt applied tissue paper to the wound site to control the bleeding. She rates her pain as 6/10. She is right-hand dominant. Pt is unsure of her tetanus status. She denies numbness or any other wounds.  Past Medical History:  Diagnosis Date  . Medical history non-contributory     Patient Active Problem List   Diagnosis Date Noted  . Postpartum state 03/11/2014  . Pregnancy induced hypertension 03/10/2014    Past Surgical History:  Procedure Laterality Date  . INDUCED ABORTION    . NO PAST SURGERIES      OB History    Gravida Para Term Preterm AB Living   3 1 1  0 2 1   SAB TAB Ectopic Multiple Live Births   1 1 0 0 1       Home Medications    Prior to Admission medications   Medication Sig Start Date End Date Taking? Authorizing Provider  acetaminophen (TYLENOL) 500 MG tablet Take 1 tablet (500 mg total) by mouth every 6 (six) hours as needed. 11/22/13   Tiffany Neva SeatGreene, PA-C  ibuprofen (ADVIL,MOTRIN) 800 MG tablet Take 1 tablet (800 mg total) by mouth every 8 (eight) hours as needed. 03/13/14   Essie HartWalda Pinn, MD    Family History Family History  Problem Relation Age of Onset  . Cancer Paternal Grandmother     grandma  . Hearing  loss Neg Hx     Social History Social History  Substance Use Topics  . Smoking status: Never Smoker  . Smokeless tobacco: Never Used  . Alcohol use No     Allergies   Review of patient's allergies indicates no known allergies.   Review of Systems Review of Systems  Skin: Positive for wound (left palm laceration).  Neurological: Negative for numbness.     Physical Exam Updated Vital Signs BP (!) 152/105 (BP Location: Right Arm)   Pulse 101   Temp 98 F (36.7 C) (Oral)   Resp 14   Ht 5' (1.524 m)   Wt 180 lb (81.6 kg)   LMP 12/13/2015   SpO2 100%   BMI 35.15 kg/m   Physical Exam  Constitutional: She is oriented to person, place, and time. She appears well-developed and well-nourished. No distress.  HENT:  Head: Normocephalic and atraumatic.  Eyes: Conjunctivae and EOM are normal.  Neck: Neck supple. No tracheal deviation present.  Cardiovascular: Normal rate.   Pulmonary/Chest: Effort normal. No respiratory distress.  Musculoskeletal: Normal range of motion.  Neurological: She is alert and oriented to person, place, and time.  Skin: Skin is warm and dry.  Left hand: 3 cm superficial oblique laceration noted to the thenar eminence on volar aspect of hand. No foreign objects noted. Not actively bleeding.  Mild TTP. Sensation intact. Brisk cap refill to left thumb.  Psychiatric: She has a normal mood and affect. Her behavior is normal.  Nursing note and vitals reviewed.   ED Treatments / Results  Labs (all labs ordered are listed, but only abnormal results are displayed) Labs Reviewed - No data to display  EKG  EKG Interpretation None       Radiology No results found.  Procedures Procedures (including critical care time)  LACERATION REPAIR Performed by: Fayrene Helper Authorized by: Fayrene Helper Consent: Verbal consent obtained. Risks and benefits: risks, benefits and alternatives were discussed Consent given by: patient Patient identity confirmed:  provided demographic data Prepped and Draped in normal sterile fashion Wound explored  Laceration Location: L hand, palmar  Laceration Length: 3cm  No Foreign Bodies seen or palpated  Anesthesia: local infiltration  Local anesthetic: lidocaine 2% w epinephrine  Anesthetic total: 3 ml  Irrigation method: syringe Amount of cleaning: standard  Skin closure: prolene 5.0  Number of sutures: 6  Technique: simple interrupted  Patient tolerance: Patient tolerated the procedure well with no immediate complications.   DIAGNOSTIC STUDIES: Oxygen Saturation is 100% on RA, normal by my interpretation.    COORDINATION OF CARE: 7:36 PM Discussed treatment plan with pt at bedside and pt agreed to plan.  Medications Ordered in ED Medications - No data to display   Initial Impression / Assessment and Plan / ED Course  I have reviewed the triage vital signs and the nursing notes.  Pertinent labs & imaging results that were available during my care of the patient were reviewed by me and considered in my medical decision making (see chart for details).  Clinical Course   BP (!) 152/105 (BP Location: Right Arm)   Pulse 101   Temp 98 F (36.7 C) (Oral)   Resp 14   Ht 5' (1.524 m)   Wt 81.6 kg   LMP 12/13/2015   SpO2 100%   BMI 35.15 kg/m   I personally performed the services described in this documentation, which was scribed in my presence. The recorded information has been reviewed and is accurate.     Final Clinical Impressions(s) / ED Diagnoses  Tetanus UTD. Laceration occurred < 12 hours prior to repair. Discussed laceration care with pt and answered questions. Pt to f-u for suture removal in 7 days and wound check sooner should there be signs of dehiscence or infection. Pt is hemodynamically stable with no complaints prior to dc.   Final diagnoses:  Hand laceration, left, initial encounter    New Prescriptions New Prescriptions   ACETAMINOPHEN-CODEINE (TYLENOL #3)  300-30 MG TABLET    Take 1 tablet by mouth every 6 (six) hours as needed for moderate pain.     Fayrene Helper, PA-C 12/20/15 2019    Arby Barrette, MD 12/24/15 [phone]

## 2015-12-20 NOTE — Discharge Instructions (Signed)
Please have your sutures removed in 7 days. Take pain medication as needed.  Return if you notice signs of infection.

## 2016-06-28 ENCOUNTER — Encounter (HOSPITAL_COMMUNITY): Payer: Self-pay

## 2016-06-28 ENCOUNTER — Emergency Department (HOSPITAL_COMMUNITY)
Admission: EM | Admit: 2016-06-28 | Discharge: 2016-06-28 | Disposition: A | Discharge status: Home / Self Care | Payer: Medicaid Other | Attending: Emergency Medicine | Admitting: Emergency Medicine

## 2016-06-28 DIAGNOSIS — S0502XA Injury of conjunctiva and corneal abrasion without foreign body, left eye, initial encounter: Secondary | ICD-10-CM

## 2016-06-28 DIAGNOSIS — Y939 Activity, unspecified: Secondary | ICD-10-CM | POA: Insufficient documentation

## 2016-06-28 DIAGNOSIS — X58XXXA Exposure to other specified factors, initial encounter: Secondary | ICD-10-CM | POA: Insufficient documentation

## 2016-06-28 DIAGNOSIS — Z79899 Other long term (current) drug therapy: Secondary | ICD-10-CM | POA: Insufficient documentation

## 2016-06-28 DIAGNOSIS — Y999 Unspecified external cause status: Secondary | ICD-10-CM | POA: Insufficient documentation

## 2016-06-28 DIAGNOSIS — Y929 Unspecified place or not applicable: Secondary | ICD-10-CM | POA: Insufficient documentation

## 2016-06-28 MED ORDER — TOBRAMYCIN 0.3 % OP SOLN: 1.0000 [drp] | OPHTHALMIC | 0 refills | Status: DC

## 2016-06-28 MED ORDER — TETRACAINE HCL 0.5 % OP SOLN
2.0000 [drp] | Freq: Once | OPHTHALMIC | Status: AC
  Administered 2016-06-28: 2 [drp] via OPHTHALMIC
  Filled 2016-06-28: qty 2

## 2016-06-28 MED ORDER — FLUORESCEIN SODIUM 0.6 MG OP STRP
1.0000 | ORAL_STRIP | Freq: Once | OPHTHALMIC | Status: AC
  Administered 2016-06-28: 1 via OPHTHALMIC
  Filled 2016-06-28: qty 1

## 2016-06-28 NOTE — Discharge Instructions (Signed)
Please read and follow all provided instructions.  Your diagnoses today include:  1. Abrasion of left cornea, initial encounter     Tests performed today include: Visual acuity testing to check your vision Fluorescein dye examination to look for scratches on your eye Vital signs. See below for your results today.   Medications prescribed:   Take any prescribed medications only as directed.  Home care instructions:  Follow any educational materials contained in this packet.  You have a scratch of the eye on the cornea (the clear part of the eye). This condition may be caused by trauma. It is a common problem for people who wear contact lenses. Proper treatment is important. No evidence of infection is noted today, but you could develop an infection called a corneal ulcer or have some retained foreign body that may or may not have been noted today in the Emergency Department. Ulcers are not only painful, but they may also scar the cornea and cause permanent damage to the eye.   If you wear contact lenses, do not use them until your eye caregiver approves. Follow-up care is necessary to be sure the corneal abrasion is healing if not completely resolved in 2-3 days. See your caregiver or eye specialist as suggested for followup.   Follow-up instructions: Please follow-up with the opthalmologist listed in the next 2-3 days for further evaluation of your symptoms.   Return instructions:  Please return to the Emergency Department if you experience worsening symptoms.  Please return immediately if you develop severe pain, pus drainage, new change in vision, or fever. Please return if you have any other emergent concerns.  Additional Information:  Your vital signs today were: BP (!) 144/88 (BP Location: Right Arm)    Pulse 67    Temp 98.4 F (36.9 C) (Oral)    Resp 16    SpO2 100%  If your blood pressure (BP) was elevated above 135/85 this visit, please have this repeated by your doctor  within one month.

## 2016-06-28 NOTE — ED Triage Notes (Signed)
Pt presents with redness and irritation to left eye that started today

## 2016-06-28 NOTE — ED Notes (Signed)
See EDP assessment 

## 2016-06-28 NOTE — ED Provider Notes (Signed)
[facility]PT Provider Note   CSN: [id number] Arrival date & time: 06/28/16  2140   By signing my name below, I, Soijett Blue, attest that this documentation has been prepared under the direction and in the presence of Audry Pili, PA-C Electronically Signed: Soijett Blue, ED Scribe. 06/28/16. 10:24 PM.  History   Chief Complaint Chief Complaint  Patient presents with  . Conjunctivitis    HPI Kelly [name] is a 26 y.o. female who presents to the Emergency Department complaining of gradually worsening left eye redness onset today. Pt reports associated left eye pain, burning sensation to left eye, left eye itching, and blurred vision. Pt has tried eye rinses without any medications for the relief of her symptoms. She notes that she does wear contacts, but denies contact flipping in her eye. Pt denies having her contacts in at this time. She denies eye crusting, fever, chills, FB to left eye, cough, and any other symptoms.  The history is provided by the patient. No language interpreter was used.    Past Medical History:  Diagnosis Date  . Medical history non-contributory     Patient Active Problem List   Diagnosis Date Noted  . Postpartum state 03/11/2014  . Pregnancy induced hypertension 03/10/2014    Past Surgical History:  Procedure Laterality Date  . INDUCED ABORTION    . NO PAST SURGERIES      OB History    Gravida Para Term Preterm AB Living   3 1 1  0 2 1   SAB TAB Ectopic Multiple Live Births   1 1 0 0 1       Home Medications    Prior to Admission medications   Medication Sig Start Date End Date Taking? Authorizing Provider  acetaminophen (TYLENOL) 500 MG tablet Take 1 tablet (500 mg total) by mouth every 6 (six) hours as needed. 11/22/13   Tiffany Neva Seat, PA-C  acetaminophen-codeine (TYLENOL #3) 300-30 MG tablet Take 1 tablet by mouth every 6 (six) hours as needed for moderate pain. 12/20/15   Fayrene Helper, PA-C  ibuprofen  (ADVIL,MOTRIN) 800 MG tablet Take 1 tablet (800 mg total) by mouth every 8 (eight) hours as needed. 03/13/14   Essie Hart, MD    Family History Family History  Problem Relation Age of Onset  . Cancer Paternal Grandmother     grandma  . Hearing loss Neg Hx     Social History Social History  Substance Use Topics  . Smoking status: Never Smoker  . Smokeless tobacco: Never Used  . Alcohol use No     Allergies   Patient has no known allergies.   Review of Systems Review of Systems  Constitutional: Negative for chills and fever.  Eyes: Positive for pain (left), redness (left), itching (left) and visual disturbance (blurred vision). Negative for discharge.  Respiratory: Negative for cough.      Physical Exam Updated Vital Signs BP (!) 144/88 (BP Location: Right Arm)   Pulse 67   Temp 98.4 F (36.9 C) (Oral)   Resp 16   SpO2 100%   Physical Exam  Constitutional: She is oriented to person, place, and time. She appears well-developed and well-nourished. No distress.  HENT:  Head: Normocephalic and atraumatic.  Eyes: EOM are normal. Pupils are equal, round, and reactive to light. Left conjunctiva is injected.  Slit lamp exam:      The left eye shows corneal abrasion and fluorescein uptake.  Left eye: Mild injected sclera. No crusting. No drainage. No proptosis.  EOM intact. No periorbital edema or erythema. PERRL. Linear corneal abrasion at 6 o'clock  Neck: Neck supple.  Cardiovascular: Normal rate.   Pulmonary/Chest: Effort normal. No respiratory distress.  Abdominal: She exhibits no distension.  Musculoskeletal: Normal range of motion.  Neurological: She is alert and oriented to person, place, and time.  Skin: Skin is warm and dry.  Psychiatric: She has a normal mood and affect. Her behavior is normal.  Nursing note and vitals reviewed.    ED Treatments / Results  DIAGNOSTIC STUDIES: Oxygen Saturation is 100% on RA, nl by my interpretation.    COORDINATION OF  CARE: 10:11 PM Discussed treatment plan with pt at bedside which includes tobramycin and pt agreed to plan.   Procedures Procedures (including critical care time)  Medications Ordered in ED Medications  tetracaine (PONTOCAINE) 0.5 % ophthalmic solution 2 drop (not administered)  fluorescein ophthalmic strip 1 strip (not administered)     Initial Impression / Assessment and Plan / ED Course  I have reviewed the triage vital signs and the nursing notes.   I have reviewed the relevant previous healthcare records. I obtained HPI from historian.  ED Course:  Assessment: Patient is a 26 y.o. female who presents to the ED with left eye redness and irritation. Pt with corneal abrasion on exam. No evidence of FB. Exam not concerning for orbital cellulitis, hyphema. No concern for uveitis. Patient will be discharged home with tobramycin. Patient understands to follow up with ophthalmology; return precautions discussed. Patient appears safe for discharged.    Disposition/Plan:  DC home Additional Verbal discharge instructions given and discussed with patient.  Pt Instructed to f/u with ophthalmologist in the next week for evaluation and treatment of symptoms. Return precautions given Pt acknowledges and agrees with plan  Supervising Physician Lavera Guiseana Duo Liu, MD  Final Clinical Impressions(s) / ED Diagnoses   Final diagnoses:  Abrasion of left cornea, initial encounter    New Prescriptions New Prescriptions   No medications on file    I personally performed the services described in this documentation, which was scribed in my presence. The recorded information has been reviewed and is accurate.    Audry Piliyler Delno Blaisdell, PA-C 06/28/16 2229    Lavera Guiseana Duo Liu, MD 06/29/16 [phone]

## 2017-02-06 ENCOUNTER — Emergency Department (HOSPITAL_COMMUNITY)
Admission: EM | Admit: 2017-02-06 | Discharge: 2017-02-06 | Disposition: A | Discharge status: Home / Self Care | Payer: Managed Care, Other (non HMO) | Attending: Emergency Medicine | Admitting: Emergency Medicine

## 2017-02-06 ENCOUNTER — Encounter (HOSPITAL_COMMUNITY): Payer: Self-pay | Admitting: Emergency Medicine

## 2017-02-06 ENCOUNTER — Emergency Department (HOSPITAL_COMMUNITY): Payer: Managed Care, Other (non HMO)

## 2017-02-06 DIAGNOSIS — R05 Cough: Secondary | ICD-10-CM | POA: Diagnosis present

## 2017-02-06 DIAGNOSIS — R0789 Other chest pain: Secondary | ICD-10-CM

## 2017-02-06 DIAGNOSIS — B9789 Other viral agents as the cause of diseases classified elsewhere: Secondary | ICD-10-CM | POA: Insufficient documentation

## 2017-02-06 DIAGNOSIS — J069 Acute upper respiratory infection, unspecified: Secondary | ICD-10-CM | POA: Insufficient documentation

## 2017-02-06 DIAGNOSIS — R0602 Shortness of breath: Secondary | ICD-10-CM

## 2017-02-06 MED ORDER — ALBUTEROL SULFATE HFA 108 (90 BASE) MCG/ACT IN AERS
2.0000 | INHALATION_SPRAY | Freq: Once | RESPIRATORY_TRACT | Status: AC
  Administered 2017-02-06: 2 via RESPIRATORY_TRACT
  Filled 2017-02-06: qty 6.7

## 2017-02-06 MED ORDER — IBUPROFEN 200 MG PO TABS
600.0000 mg | ORAL_TABLET | Freq: Once | ORAL | Status: AC
  Administered 2017-02-06: 09:00:00 600 mg via ORAL
  Filled 2017-02-06: qty 1

## 2017-02-06 NOTE — ED Provider Notes (Signed)
[facility]   CSN: [id number] Arrival date & time: 02/06/17  0806     History   Chief Complaint Chief Complaint  Patient presents with  . URI    HPI  Kelly [name] is a 26 y.o. Female with no pertinent past medical history, presents complaining of cough and nasal congestion, with some shortness of breath and chest discomfort.  She reports symptoms started Thursday when she woke up with a dry scratchy throat and runny nose, patient reports the symptoms have been constant and not improving.  Reports she has had sweats and chills, has not taken her temperature. Reports yesterday and last night she started having some shortness of breath and chest discomfort.  Patient reports chest discomfort feels more like a tightness, present primarily when she coughs, she is not complaining of any chest pain or discomfort right now. Denies worsening of chest pain with exertion. Denies exogenous estrogen use, lower extremity pain or swelling, recent travel or immobilization, history of PE or DVT, family or personal history of bleeding or clotting disorders, hemoptysis.  Patient reports she has been trying to treat symptoms at home with [employer] cold and flu, as well as Sudafed and she has had minimal improvement.  Patient denies tobacco use, no history of asthma, no history of heart problems.       Past Medical History:  Diagnosis Date  . Medical history non-contributory     Patient Active Problem List   Diagnosis Date Noted  . Postpartum state 03/11/2014  . Pregnancy induced hypertension 03/10/2014    Past Surgical History:  Procedure Laterality Date  . INDUCED ABORTION    . NO PAST SURGERIES      OB History    Gravida Para Term Preterm AB Living   3 1 1  0 2 1   SAB TAB Ectopic Multiple Live Births   1 1 0 0 1       Home Medications    Prior to Admission medications   Medication Sig Start Date End Date Taking?  Authorizing Provider  acetaminophen (TYLENOL) 500 MG tablet Take 1 tablet (500 mg total) by mouth every 6 (six) hours as needed. 11/22/13   Marlon PelGreene, Tiffany, PA-C  acetaminophen-codeine (TYLENOL #3) 300-30 MG tablet Take 1 tablet by mouth every 6 (six) hours as needed for moderate pain. 12/20/15   Fayrene Helperran, Bowie, PA-C  ibuprofen (ADVIL,MOTRIN) 800 MG tablet Take 1 tablet (800 mg total) by mouth every 8 (eight) hours as needed. 03/13/14   Essie HartPinn, Walda, MD  tobramycin (TOBREX) 0.3 % ophthalmic solution Place 1 drop into the left eye every 4 (four) hours. For 5 days 06/28/16   Audry PiliMohr, Tyler, PA-C    Family History Family History  Problem Relation Age of Onset  . Cancer Paternal Grandmother        grandma  . Hearing loss Neg Hx     Social History Social History  Substance Use Topics  . Smoking status: Never Smoker  . Smokeless tobacco: Never Used  . Alcohol use No     Allergies   Patient has no known allergies.   Review of Systems Review of Systems  Constitutional: Positive for chills. Negative for fever.  HENT: Positive for congestion, ear pain (pressure), postnasal drip, rhinorrhea, sinus pain, sinus pressure and sore throat. Negative for ear discharge, trouble swallowing and voice change.   Eyes: Negative for discharge and itching.  Respiratory: Positive for cough, chest tightness and shortness of breath. Negative  for wheezing.   Cardiovascular: Positive for chest pain.  Gastrointestinal: Negative for abdominal pain, diarrhea, nausea and vomiting.  Genitourinary: Negative for dysuria.  Skin: Negative for rash.  Neurological: Negative for headaches.     Physical Exam Updated Vital Signs BP (!) 148/100 (BP Location: Right Arm)   Pulse (!) 102   Temp 99.9 F (37.7 C) (Oral)   Resp 18   SpO2 95%   Physical Exam  Constitutional: She is oriented to person, place, and time. She appears well-developed and well-nourished. No distress.  HENT:  Head: Normocephalic and atraumatic.  TMs  clear with good landmarks, moderate nasal mucosa edema with clear rhinorrhea, posterior oropharynx clear and moist, with some erythema, minimal edema and no exudates. Uvula midline, no trismus   Eyes: Right eye exhibits no discharge. Left eye exhibits no discharge.  Neck: Normal range of motion. Neck supple.  Cardiovascular: Normal rate, regular rhythm, normal heart sounds and intact distal pulses.   Pulmonary/Chest: Effort normal and breath sounds normal. No respiratory distress. She has no wheezes. She has no rales. She exhibits no tenderness.  Abdominal: Soft. Bowel sounds are normal. She exhibits no distension and no mass. There is no tenderness. There is no guarding.  Musculoskeletal: She exhibits no edema or deformity.  No calf tenderness  Lymphadenopathy:    She has cervical adenopathy (mildly tender, easily moveable.  Neurological: She is alert and oriented to person, place, and time. Coordination normal.  Skin: Skin is warm and dry. Capillary refill takes less than 2 seconds. She is not diaphoretic.  Psychiatric: She has a normal mood and affect. Her behavior is normal.  Nursing note and vitals reviewed.    ED Treatments / Results  Labs (all labs ordered are listed, but only abnormal results are displayed) Labs Reviewed - No data to display  EKG  EKG Interpretation  Date/Time:  Saturday February 06 2017 08:08:19 EDT Ventricular Rate:  101 PR Interval:  134 QRS Duration: 70 QT Interval:  346 QTC Calculation: 448 R Axis:   66 Text Interpretation:  Sinus tachycardia Nonspecific T wave abnormality Abnormal ECG When compared to prior, no significant chacnges seen.  No STEMI Confirmed by Theda Belfast ([id number]) on 02/06/2017 8:23:39 AM       Radiology Dg Chest 2 View  Result Date: 02/06/2017 CLINICAL DATA:  Dry cough and pain. EXAM: CHEST  2 VIEW COMPARISON:  None. FINDINGS: The heart size and mediastinal contours are within normal limits. Both lungs are clear. The  visualized skeletal structures are unremarkable. IMPRESSION: No active cardiopulmonary disease. Electronically Signed   By: Gerome Sam III M.D   On: 02/06/2017 09:07    Procedures Procedures (including critical care time)  Medications Ordered in ED Medications  ibuprofen (ADVIL,MOTRIN) tablet 600 mg (600 mg Oral Given 02/06/17 0925)  albuterol (PROVENTIL HFA;VENTOLIN HFA) 108 (90 Base) MCG/ACT inhaler 2 puff (2 puffs Inhalation Given 02/06/17 0925)     Initial Impression / Assessment and Plan / ED Course  I have reviewed the triage vital signs and the nursing notes.  Pertinent labs & imaging results that were available during my care of the patient were reviewed by me and considered in my medical decision making (see chart for details).  Presents with cough, nasal congestion, shortness of breath and chest tightness.  Lungs clear to auscultation bilaterally, patient was initially tachycardic, heart rate improved on exam.  No increased work of breathing or evidence of respiratory distress.  EKG shows sinus tach, no other abnormalities.  Pt CXR negative for acute infiltrate. Unlikely PE tachycardia not persistent, pt otherwise PERC negative. Patients symptoms are consistent with URI, likely viral etiology. Discussed that antibiotics are not indicated for viral infections. Pt will be discharged with symptomatic treatment, and albuterol for chest tightness.  Verbalizes understanding and is agreeable with plan. Pt is hemodynamically stable & in NAD prior to dc.   Final Clinical Impressions(s) / ED Diagnoses   Final diagnoses:  Viral URI with cough  SOB (shortness of breath)  Chest tightness    New Prescriptions Discharge Medication List as of 02/06/2017  9:45 AM       Dartha Lodge, PA-C 02/06/17 1903    Tegeler, Canary Brim, MD 02/06/17 1950

## 2017-02-06 NOTE — ED Triage Notes (Signed)
Pt states she has had a cough, runny nose with sore throat/dry throat for several days. Denies hx of asthma, not a smoker, denies COPD hx. Afebrile.

## 2017-02-06 NOTE — Discharge Instructions (Signed)
Your workup is reassuring, symptoms likely caused by a viral upper respiratory infection.  You can treat symptoms at home with over-the-counter medications such as Sudafed, Flonase and cough syrups, you can use ibuprofen or Tylenol for pain.  Keep throat bathed with warm teas with honey, water throat lozenges.  If you develop persistent fevers, worsening chest pain or shortness of breath or other new or concerning symptoms please return to the emergency department for reevaluation otherwise please follow-up with the community [facility] and wellness clinic as needed for routine follow-up.

## 2017-06-07 ENCOUNTER — Ambulatory Visit (INDEPENDENT_AMBULATORY_CARE_PROVIDER_SITE_OTHER): Payer: Managed Care, Other (non HMO) | Admitting: Obstetrics and Gynecology

## 2017-06-07 ENCOUNTER — Encounter: Payer: Self-pay | Admitting: Obstetrics and Gynecology

## 2017-06-07 VITALS — BP 140/97 | HR 84 | Ht 60.0 in | Wt 182.6 lb

## 2017-06-07 DIAGNOSIS — Z113 Encounter for screening for infections with a predominantly sexual mode of transmission: Secondary | ICD-10-CM

## 2017-06-07 DIAGNOSIS — Z1151 Encounter for screening for human papillomavirus (HPV): Secondary | ICD-10-CM

## 2017-06-07 DIAGNOSIS — Z01419 Encounter for gynecological examination (general) (routine) without abnormal findings: Secondary | ICD-10-CM | POA: Diagnosis not present

## 2017-06-07 DIAGNOSIS — Z124 Encounter for screening for malignant neoplasm of cervix: Secondary | ICD-10-CM

## 2017-06-07 NOTE — Progress Notes (Signed)
Subjective:     Kelly [name] is a 27 y.o. female [phone] with BMI 35 and LMP 05/18/2017 who is here for a comprehensive physical exam. The patient reports no problems. She reports a monthly period with vaginal bleeding for 3-5 days. She is sexually active using Mirena IUD for contraception since 2016. She denies any pelvic pain or abnormal discharge. She desires STD screen without blood work  Past Medical History:  Diagnosis Date  . Medical history non-contributory    Past Surgical History:  Procedure Laterality Date  . INDUCED ABORTION    . NO PAST SURGERIES     Family History  Problem Relation Age of Onset  . Cancer Paternal Grandmother        grandma  . Hearing loss Neg Hx     Social History   Socioeconomic History  . Marital status: Single    Spouse name: Not on file  . Number of children: Not on file  . Years of education: Not on file  . Highest education level: Not on file  Social Needs  . Financial resource strain: Not on file  . Food insecurity - worry: Not on file  . Food insecurity - inability: Not on file  . Transportation needs - medical: Not on file  . Transportation needs - non-medical: Not on file  Occupational History  . Not on file  Tobacco Use  . Smoking status: Never Smoker  . Smokeless tobacco: Never Used  Substance and Sexual Activity  . Alcohol use: No  . Drug use: No  . Sexual activity: Yes    Birth control/protection: IUD  Other Topics Concern  . Not on file  Social History Narrative  . Not on file   Health Maintenance  Topic Date Due  . TETANUS/TDAP  07/01/2007  . PAP SMEAR  06/30/2009  . INFLUENZA VACCINE  11/04/2016  . HIV Screening  Completed       Review of Systems Pertinent items are noted in HPI.   Objective:  Blood pressure (!) 140/97, pulse 84, height 5' (1.524 m), weight 182 lb 9.6 oz (82.8 kg), last menstrual period 05/18/2017, unknown if currently breastfeeding.     GENERAL: Well-developed,  well-nourished female in no acute distress.  HEENT: Normocephalic, atraumatic. Sclerae anicteric.  NECK: Supple. Normal thyroid.  LUNGS: Clear to auscultation bilaterally.  HEART: Regular rate and rhythm. BREASTS: Symmetric in size. No palpable masses or lymphadenopathy, skin changes, or nipple drainage. ABDOMEN: Soft, nontender, nondistended. No organomegaly. PELVIC: Normal external female genitalia. Vagina is pink and rugated.  Normal discharge. Normal appearing cervix. Uterus is normal in size. No adnexal mass or tenderness. EXTREMITIES: No cyanosis, clubbing, or edema, 2+ distal pulses.    Assessment:    Healthy female exam.      Plan:    pap smear and cultures collected Discussed elevated BP today at during several ED visits. Patient with diagnosis of HTN and instructed to follow up with PCP for further evaluation and management Patient will be contacted with abnormal results See After Visit Summary for Counseling Recommendations

## 2017-06-07 NOTE — Patient Instructions (Signed)

## 2017-06-08 LAB — CERVICOVAGINAL ANCILLARY ONLY
BACTERIAL VAGINITIS: POSITIVE — AB
Candida vaginitis: NEGATIVE
Chlamydia: NEGATIVE
NEISSERIA GONORRHEA: NEGATIVE
TRICH (WINDOWPATH): NEGATIVE

## 2017-06-09 ENCOUNTER — Other Ambulatory Visit: Payer: Self-pay | Admitting: Obstetrics and Gynecology

## 2017-06-09 MED ORDER — CLINDAMYCIN PHOSPHATE 2 % VA CREA: 1.0000 | TOPICAL_CREAM | Freq: Every day | VAGINAL | 0 refills | Status: DC

## 2017-06-11 LAB — CYTOLOGY - PAP
DIAGNOSIS: UNDETERMINED — AB
HPV: NOT DETECTED

## 2018-05-15 ENCOUNTER — Encounter: Payer: Self-pay | Admitting: Emergency Medicine

## 2018-05-15 ENCOUNTER — Ambulatory Visit (HOSPITAL_COMMUNITY)
Admission: EM | Admit: 2018-05-15 | Discharge: 2018-05-15 | Disposition: A | Discharge status: Home / Self Care | Payer: Managed Care, Other (non HMO) | Attending: Internal Medicine | Admitting: Internal Medicine

## 2018-05-15 ENCOUNTER — Other Ambulatory Visit: Payer: Self-pay

## 2018-05-15 DIAGNOSIS — Z3202 Encounter for pregnancy test, result negative: Secondary | ICD-10-CM | POA: Diagnosis not present

## 2018-05-15 DIAGNOSIS — J111 Influenza due to unidentified influenza virus with other respiratory manifestations: Secondary | ICD-10-CM

## 2018-05-15 DIAGNOSIS — I10 Essential (primary) hypertension: Secondary | ICD-10-CM | POA: Insufficient documentation

## 2018-05-15 DIAGNOSIS — R69 Illness, unspecified: Secondary | ICD-10-CM | POA: Diagnosis not present

## 2018-05-15 LAB — POCT URINALYSIS DIP (DEVICE)
Glucose, UA: NEGATIVE mg/dL
Leukocytes, UA: NEGATIVE
Nitrite: NEGATIVE
Protein, ur: NEGATIVE mg/dL
Specific Gravity, Urine: 1.025 (ref 1.005–1.030)
Urobilinogen, UA: 0.2 mg/dL (ref 0.0–1.0)
pH: 5.5 (ref 5.0–8.0)

## 2018-05-15 LAB — POCT RAPID STREP A: Streptococcus, Group A Screen (Direct): NEGATIVE

## 2018-05-15 LAB — POCT PREGNANCY, URINE: Preg Test, Ur: NEGATIVE

## 2018-05-15 MED ORDER — AMLODIPINE BESYLATE 5 MG PO TABS: 5.0000 mg | ORAL_TABLET | Freq: Every day | ORAL | 1 refills | Status: DC

## 2018-05-15 MED ORDER — OSELTAMIVIR PHOSPHATE 75 MG PO CAPS: 75.0000 mg | ORAL_CAPSULE | Freq: Two times a day (BID) | ORAL | 0 refills | Status: DC

## 2018-05-15 NOTE — ED Triage Notes (Signed)
The patient presented to the [facility] with a complaint of "dizziness and lightheadedness." The patient reported that she has "been sick" for a few days and felt that her BP was off and she checked it at the drug store and it was 139/86.

## 2018-05-15 NOTE — ED Provider Notes (Signed)
[facility]    CSN: [id number] Arrival date & time: 05/15/18  1238     History   Chief Complaint Chief Complaint  Patient presents with  . Dizziness    HPI Kelly [name] is a 28 y.o. female.   28 year old female with no chronic medical problems presents to urgent care complaining of lightheadedness and dizziness.  She also reports sore throat and body aches x3 to 4 days.  She reports that she has not been able to eat much over the last week but she denies nausea, vomiting or diarrhea.  Notably, the patient also states that her blood pressure has been high on a number of her previous doctor visits.     History reviewed. No pertinent past medical history.  There are no active problems to display for this patient.   History reviewed. No pertinent surgical history.  OB History   No obstetric history on file.      Home Medications    Prior to Admission medications   Medication Sig Start Date End Date Taking? Authorizing Provider  amLODipine (NORVASC) 5 MG tablet Take 1 tablet (5 mg total) by mouth daily. 05/15/18 07/14/18  Kelly [name], Kelly [name], Kelly [name]  oseltamivir (TAMIFLU) 75 MG capsule Take 1 capsule (75 mg total) by mouth every 12 (twelve) hours. 05/15/18   Kelly [name], Kelly [name], Kelly [name]    Family History History reviewed. No pertinent family history.  Social History Social History   Tobacco Use  . Smoking status: Never Smoker  . Smokeless tobacco: Never Used  Substance Use Topics  . Alcohol use: Never    Frequency: Never  . Drug use: Never     Allergies   Patient has no known allergies.   Review of Systems Review of Systems  Constitutional: Positive for fever (Subjective). Negative for chills.  HENT: Negative for sore throat and tinnitus.   Eyes: Negative for redness.  Respiratory: Negative for cough and shortness of breath.   Cardiovascular: Negative for chest pain and palpitations.  Gastrointestinal: Negative for abdominal pain, diarrhea, nausea and  vomiting.  Genitourinary: Negative for dysuria, frequency and urgency.  Musculoskeletal: Negative for myalgias.  Skin: Negative for rash.       No lesions  Neurological: Positive for dizziness and light-headedness. Negative for weakness.  Hematological: Does not bruise/bleed easily.  Psychiatric/Behavioral: Negative for suicidal ideas.     Physical Exam Triage Vital Signs ED Triage Vitals  Enc Vitals Group     BP 05/15/18 1338 (!) 141/90     Pulse Rate 05/15/18 1338 91     Resp 05/15/18 1338 18     Temp 05/15/18 1338 99.3 F (37.4 C)     Temp Source 05/15/18 1338 Temporal     SpO2 05/15/18 1338 100 %     Weight --      Height --      Head Circumference --      Peak Flow --      Pain Score 05/15/18 1340 4     Pain Loc --      Pain Edu? --      Excl. in GC? --    No data found.  Updated Vital Signs BP (!) 141/90 (BP Location: Left Arm)   Pulse 91   Temp 99.3 F (37.4 C) (Temporal)   Resp 18   LMP 04/14/2018   SpO2 100%   Visual Acuity Right Eye Distance:   Left Eye Distance:   Bilateral Distance:    Right Eye Near:  Left Eye Near:    Bilateral Near:     Physical Exam Vitals signs and nursing note reviewed.  Constitutional:      General: She is not in acute distress.    Appearance: She is well-developed.  HENT:     Head: Normocephalic and atraumatic.  Eyes:     General: No scleral icterus.    Conjunctiva/sclera: Conjunctivae normal.     Pupils: Pupils are equal, round, and reactive to light.  Neck:     Musculoskeletal: Normal range of motion and neck supple.     Thyroid: No thyromegaly.     Vascular: No JVD.     Trachea: No tracheal deviation.  Cardiovascular:     Rate and Rhythm: Normal rate and regular rhythm.     Heart sounds: Normal heart sounds. No murmur. No friction rub. No gallop.   Pulmonary:     Effort: Pulmonary effort is normal.     Breath sounds: Normal breath sounds.  Abdominal:     General: Bowel sounds are normal. There is no  distension.     Palpations: Abdomen is soft.     Tenderness: There is no abdominal tenderness.  Musculoskeletal: Normal range of motion.  Lymphadenopathy:     Cervical: No cervical adenopathy.  Skin:    General: Skin is warm and dry.  Neurological:     Mental Status: She is alert and oriented to person, place, and time.     Cranial Nerves: No cranial nerve deficit.  Psychiatric:        Behavior: Behavior normal.        Thought Content: Thought content normal.        Judgment: Judgment normal.      UC Treatments / Results  Labs (all labs ordered are listed, but only abnormal results are displayed) Labs Reviewed  POCT URINALYSIS DIP (DEVICE) - Abnormal; Notable for the following components:      Result Value   Bilirubin Urine SMALL (*)    Ketones, ur TRACE (*)    Hgb urine dipstick MODERATE (*)    All other components within normal limits  CULTURE, GROUP A STREP ([facility])  POC URINE PREG, ED  POCT RAPID STREP A  POCT PREGNANCY, URINE    EKG None  Radiology No results found.  Procedures Procedures (including critical care time)  Medications Ordered in UC Medications - No data to display  Initial Impression / Assessment and Plan / UC Course  I have reviewed the triage vital signs and the nursing notes.  Pertinent labs & imaging results that were available during my care of the patient were reviewed by me and considered in my medical decision making (see chart for details).     Strep negative.  Urine pregnancy negative.  Urinalysis shows ketones and increased spec gravity.  Clinically dehydrated with the patient'[name] blood pressure is still elevated indicating that her elevated readings in the past are likely consistent with hypertension.  I prescribed her amlodipine and emphasized that she must follow-up with her primary care doctor.  For her body aches and flulike symptoms I prescribed Tamiflu which she may take at her discretion.  Final Clinical Impressions([name]) / UC  Diagnoses   Final diagnoses:  Hypertension, unspecified type  Influenza-like illness   Discharge Instructions   None    ED Prescriptions    Medication Sig Dispense Auth. Provider   oseltamivir (TAMIFLU) 75 MG capsule Take 1 capsule (75 mg total) by mouth every 12 (twelve) hours. 10 capsule Sheryle Hail,  Kelly PillarMichael [name], Kelly [name]   amLODipine (NORVASC) 5 MG tablet Take 1 tablet (5 mg total) by mouth daily. 30 tablet Kelly [name], Levana Minetti [name], Kelly [name]     Controlled Substance Prescriptions [place] Controlled Substance Registry consulted? Not Applicable   Kelly [name], Devynne Sturdivant [name], Kelly [name] 05/15/18 [phone]

## 2018-05-16 ENCOUNTER — Encounter: Payer: Self-pay | Admitting: Obstetrics and Gynecology

## 2018-05-18 LAB — CULTURE, GROUP A STREP (THRC)

## 2018-07-12 ENCOUNTER — Ambulatory Visit (HOSPITAL_COMMUNITY)
Admission: EM | Admit: 2018-07-12 | Discharge: 2018-07-12 | Disposition: A | Discharge status: Home / Self Care | Payer: Managed Care, Other (non HMO) | Attending: Family Medicine | Admitting: Family Medicine

## 2018-07-12 ENCOUNTER — Other Ambulatory Visit: Payer: Self-pay

## 2018-07-12 ENCOUNTER — Encounter (HOSPITAL_COMMUNITY): Payer: Self-pay

## 2018-07-12 DIAGNOSIS — N898 Other specified noninflammatory disorders of vagina: Secondary | ICD-10-CM | POA: Diagnosis not present

## 2018-07-12 DIAGNOSIS — N390 Urinary tract infection, site not specified: Secondary | ICD-10-CM | POA: Diagnosis present

## 2018-07-12 LAB — POCT URINALYSIS DIP (DEVICE)
Bilirubin Urine: NEGATIVE
Bilirubin Urine: NEGATIVE
Glucose, UA: NEGATIVE mg/dL
Glucose, UA: NEGATIVE mg/dL
Ketones, ur: NEGATIVE mg/dL
Ketones, ur: NEGATIVE mg/dL
Leukocytes,Ua: NEGATIVE
Nitrite: POSITIVE — AB
Nitrite: POSITIVE — AB
Protein, ur: 100 mg/dL — AB
Protein, ur: 30 mg/dL — AB
Specific Gravity, Urine: >=1.03 (ref 1.005–1.030)
Specific Gravity, Urine: >=1.03 (ref 1.005–1.030)
Urobilinogen, UA: 0.2 mg/dL (ref 0.0–1.0)
Urobilinogen, UA: 0.2 mg/dL (ref 0.0–1.0)
pH: 6 (ref 5.0–8.0)
pH: 6 (ref 5.0–8.0)

## 2018-07-12 LAB — POCT PREGNANCY, URINE: Preg Test, Ur: NEGATIVE

## 2018-07-12 MED ORDER — NITROFURANTOIN MONOHYD MACRO 100 MG PO CAPS: 100.0000 mg | ORAL_CAPSULE | Freq: Two times a day (BID) | ORAL | 0 refills | Status: DC

## 2018-07-12 MED ORDER — METRONIDAZOLE 1 % EX GEL: Freq: Every day | CUTANEOUS | 0 refills | Status: AC

## 2018-07-12 NOTE — ED Notes (Signed)
Patient able to ambulate independently  

## 2018-07-12 NOTE — ED Provider Notes (Signed)
[facility]    CSN: [id number] Arrival date & time: 07/12/18  4920     History   Chief Complaint No chief complaint on file.   HPI Kelly [name] is a 28 y.o. female.   HPI  Patient presents today with concern of hematuria x 1 day. Associated symptoms include dysuria and urine frequency. Denies back pain, chills, abdominal pain, nausea, or vomiting. She endorses vaginal discharge present for about one February and March month. Completed metronidazole cream. BV continues to recur and is having vaginal discharge now. She is requesting STD testing today. Past Medical History:  Diagnosis Date  . Medical history non-contributory     Patient Active Problem List   Diagnosis Date Noted  . Postpartum state 03/11/2014  . Pregnancy induced hypertension 03/10/2014    Past Surgical History:  Procedure Laterality Date  . INDUCED ABORTION    . NO PAST SURGERIES      OB History    Gravida  3   Para  1   Term  1   Preterm  0   AB  2   Living  1     SAB  1   TAB  1   Ectopic  0   Multiple      Live Births  1            Home Medications    Prior to Admission medications   Medication Sig Start Date End Date Taking? Authorizing Provider  acetaminophen (TYLENOL) 500 MG tablet Take 1 tablet (500 mg total) by mouth every 6 (six) hours as needed. 11/22/13   Marlon Pel, PA-C  amLODipine (NORVASC) 5 MG tablet Take 1 tablet (5 mg total) by mouth daily. 05/15/18 07/14/18  Arnaldo Natal, MD  clindamycin (CLEOCIN) 2 % vaginal cream Place 1 Applicatorful vaginally at bedtime. For 7 days 06/09/17   Constant, Peggy, MD  ibuprofen (ADVIL,MOTRIN) 800 MG tablet Take 1 tablet (800 mg total) by mouth every 8 (eight) hours as needed. 03/13/14   Essie Hart, MD  oseltamivir (TAMIFLU) 75 MG capsule Take 1 capsule (75 mg total) by mouth every 12 (twelve) hours. 05/15/18   Arnaldo Natal, MD    Family History Family History  Problem Relation Age of  Onset  . Hypertension Mother   . Diabetes Father   . Cancer Paternal Grandmother        grandma  . Hearing loss Neg Hx     Social History Social History   Tobacco Use  . Smoking status: Never Smoker  . Smokeless tobacco: Never Used  Substance Use Topics  . Alcohol use: Never    Frequency: Never  . Drug use: Never     Allergies   Patient has no known allergies.   Review of Systems Review of Systems Pertinent negatives listed in HPI Physical Exam Triage Vital Signs ED Triage Vitals  Enc Vitals Group     BP      Pulse      Resp      Temp      Temp src      SpO2      Weight      Height      Head Circumference      Peak Flow      Pain Score      Pain Loc      Pain Edu?      Excl. in GC?    No data found.  Updated Vital Signs  BP (!) 136/95 (BP Location: Right Arm)   Pulse 80   Temp 98.6 F (37 C) (Oral)   Resp 18   Wt 186 lb (84.4 kg)   LMP 06/07/2018   SpO2 98%   BMI 36.33 kg/m   Visual Acuity Right Eye Distance:   Left Eye Distance:   Bilateral Distance:    Right Eye Near:   Left Eye Near:    Bilateral Near:     Physical Exam General appearance: alert, well developed, well nourished, cooperative and in no distress Head: Normocephalic, without obvious abnormality, atraumatic Respiratory: Respirations even and unlabored, normal respiratory rate Heart: rate and rhythm normal. No gallop or murmurs noted on exam  Abdomen: BS +, no distention, no rebound tenderness, or no mass Extremities: No gross deformities Skin: Skin color, texture, turgor normal. No rashes seen  Psych: Appropriate mood and affect. Neurologic: Alert, oriented to person, place, and time, thought content appropriate. Vaginal Specimen self-collected specimen. UC Treatments / Results  Labs (all labs ordered are listed, but only abnormal results are displayed) Labs Reviewed  URINE CULTURE - Abnormal; Notable for the following components:      Result Value   Culture   (*)     Value: >=100,000 COLONIES/mL ESCHERICHIA COLI >=100,000 COLONIES/mL GROUP B STREP(S.AGALACTIAE)ISOLATED TESTING AGAINST S. AGALACTIAE NOT ROUTINELY PERFORMED DUE TO PREDICTABILITY OF AMP/PEN/VAN SUSCEPTIBILITY. Performed at [facility], [address], [place], [place] [address]    Organism ID, Bacteria ESCHERICHIA COLI (*)    All other components within normal limits  POCT URINALYSIS DIP (DEVICE) - Abnormal; Notable for the following components:   Hgb urine dipstick LARGE (*)    Protein, ur 30 (*)    Nitrite POSITIVE (*)    All other components within normal limits  POCT URINALYSIS DIP (DEVICE) - Abnormal; Notable for the following components:   Hgb urine dipstick LARGE (*)    Protein, ur 100 (*)    Nitrite POSITIVE (*)    Leukocytes,Ua LARGE (*)    All other components within normal limits  CERVICOVAGINAL ANCILLARY ONLY - Abnormal; Notable for the following components:   Candida vaginitis **POSITIVE for Candida species** (*)    All other components within normal limits  POC URINE PREG, ED  POCT PREGNANCY, URINE    EKG None  Radiology No results found.  Procedures Procedures (including critical care time)  Medications Ordered in UC Medications - No data to display  Initial Impression / Assessment and Plan / UC Course  I have reviewed the triage vital signs and the nursing notes.  Pertinent labs & imaging results that were available during my care of the patient were reviewed by me and considered in my medical decision making (see chart for details).  Urine dipstick significant for UTI. Vaginal specimen collected and pending. Will treat UTI empirically with nitrofurantion 100 BID x 10 days. Urine culture pending. Metrogel prescribed for vaginal discharge given recurrence of bacterial vaginosis. Patient advised to follow-up with PCP or return for care if symptoms worsen or do not improve. Patient verbalized understanding and agreement with plan.  Final Clinical  Impressions(s) / UC Diagnoses   Final diagnoses:  Vaginal discharge  Lower urinary tract infectious disease   Discharge Instructions   None    ED Prescriptions    Medication Sig Dispense Auth. Provider   nitrofurantoin, macrocrystal-monohydrate, (MACROBID) 100 MG capsule Take 1 capsule (100 mg total) by mouth 2 (two) times daily. 20 capsule Bing Neighbors, FNP   metroNIDAZOLE (  METROGEL) 1 % gel Apply topically daily for 5 days. 60 g Bing NeighborsHarris, Halimah Bewick S, FNP     Controlled Substance Prescriptions [place] Controlled Substance Registry consulted? Not Applicable   Bing NeighborsHarris, Willis Holquin S, FNP 07/14/18 1431

## 2018-07-12 NOTE — ED Triage Notes (Signed)
Pt states she woke up in the middle of the night with painful urination and she had blood in her urine. This happened last night.

## 2018-07-13 LAB — CERVICOVAGINAL ANCILLARY ONLY
Bacterial vaginitis: NEGATIVE
Candida vaginitis: POSITIVE — AB
Chlamydia: NEGATIVE
Neisseria Gonorrhea: NEGATIVE
Trichomonas: NEGATIVE

## 2018-07-14 ENCOUNTER — Telehealth (HOSPITAL_COMMUNITY): Payer: Self-pay | Admitting: Emergency Medicine

## 2018-07-14 LAB — URINE CULTURE
Culture: >=100000 — AB
Special Requests: NORMAL

## 2018-07-14 MED ORDER — FLUCONAZOLE 150 MG PO TABS: 150.0000 mg | ORAL_TABLET | Freq: Once | ORAL | 0 refills | Status: AC

## 2018-07-14 NOTE — Telephone Encounter (Signed)
Urine culture was positive for e coli and was given macrobid  at urgent care visit. Pt contacted and made aware, educated on completing antibiotic and to follow up if symptoms are persistent. Verbalized understanding.   Test for candida (yeast) was positive.  Prescription for fluconazole 150mg  po now, repeat dose in 3d if needed, #2 no refills, sent to the pharmacy of record.  Recheck or followup with PCP for further evaluation if symptoms are not improving.

## 2019-09-19 ENCOUNTER — Other Ambulatory Visit (HOSPITAL_BASED_OUTPATIENT_CLINIC_OR_DEPARTMENT_OTHER): Payer: Self-pay

## 2019-09-19 DIAGNOSIS — R0683 Snoring: Secondary | ICD-10-CM

## 2019-09-19 DIAGNOSIS — R5383 Other fatigue: Secondary | ICD-10-CM

## 2019-09-19 DIAGNOSIS — G4710 Hypersomnia, unspecified: Secondary | ICD-10-CM

## 2019-09-29 ENCOUNTER — Other Ambulatory Visit: Payer: Self-pay

## 2019-09-29 ENCOUNTER — Ambulatory Visit (HOSPITAL_BASED_OUTPATIENT_CLINIC_OR_DEPARTMENT_OTHER): Payer: Managed Care, Other (non HMO) | Admitting: Internal Medicine

## 2019-10-04 ENCOUNTER — Ambulatory Visit (HOSPITAL_BASED_OUTPATIENT_CLINIC_OR_DEPARTMENT_OTHER): Payer: Managed Care, Other (non HMO) | Attending: Internal Medicine | Admitting: Pulmonary Disease

## 2020-05-21 ENCOUNTER — Ambulatory Visit
Admission: EM | Admit: 2020-05-21 | Discharge: 2020-05-21 | Disposition: A | Discharge status: Home / Self Care | Payer: Managed Care, Other (non HMO)

## 2021-10-21 ENCOUNTER — Other Ambulatory Visit: Payer: Self-pay

## 2021-10-21 ENCOUNTER — Encounter (HOSPITAL_COMMUNITY): Payer: Self-pay | Admitting: Emergency Medicine

## 2021-10-21 ENCOUNTER — Ambulatory Visit (HOSPITAL_COMMUNITY)
Admission: EM | Admit: 2021-10-21 | Discharge: 2021-10-21 | Disposition: A | Discharge status: Home / Self Care | Payer: Self-pay | Attending: Internal Medicine | Admitting: Internal Medicine

## 2021-10-21 DIAGNOSIS — J020 Streptococcal pharyngitis: Secondary | ICD-10-CM

## 2021-10-21 HISTORY — DX: Essential (primary) hypertension: I10

## 2021-10-21 LAB — POCT RAPID STREP A, ED / UC: Streptococcus, Group A Screen (Direct): POSITIVE — AB

## 2021-10-21 MED ORDER — IBUPROFEN 600 MG PO TABS: 600.0000 mg | ORAL_TABLET | Freq: Four times a day (QID) | ORAL | 0 refills | Status: DC | PRN

## 2021-10-21 MED ORDER — AMOXICILLIN 500 MG PO CAPS: 500.0000 mg | ORAL_CAPSULE | Freq: Two times a day (BID) | ORAL | 0 refills | Status: AC

## 2021-10-21 MED ORDER — ONDANSETRON 4 MG PO TBDP
ORAL_TABLET | ORAL | Status: AC
  Filled 2021-10-21: qty 1

## 2021-10-21 MED ORDER — IBUPROFEN 800 MG PO TABS
800.0000 mg | ORAL_TABLET | Freq: Once | ORAL | Status: AC
  Administered 2021-10-21: 800 mg via ORAL

## 2021-10-21 MED ORDER — ACETAMINOPHEN 500 MG PO TABS: 1000.0000 mg | ORAL_TABLET | Freq: Four times a day (QID) | ORAL | 0 refills | Status: AC | PRN

## 2021-10-21 MED ORDER — ONDANSETRON 4 MG PO TBDP
4.0000 mg | ORAL_TABLET | Freq: Once | ORAL | Status: AC
  Administered 2021-10-21: 4 mg via ORAL

## 2021-10-21 MED ORDER — IBUPROFEN 800 MG PO TABS
ORAL_TABLET | ORAL | Status: AC
  Filled 2021-10-21: qty 1

## 2021-10-21 NOTE — Discharge Instructions (Signed)
Take amoxicillin twice daily for the next 10 days to treat your strep throat.  Continue to take ibuprofen 600 mg every 6 hours and Tylenol 1000 mg every 6 hours as needed for your sore throat pain and fever at home.  You were given 800 mg ibuprofen in the clinic so you may take more than 8 hours at 7:30 PM tonight with food if needed.   If you develop any new or worsening symptoms or do not improve in the next 2 to 3 days, please return.  If your symptoms are severe, please go to the emergency room.  Follow-up with your primary care provider for further evaluation and management of your symptoms as well as ongoing wellness visits.  I hope you feel better!

## 2021-10-21 NOTE — ED Provider Notes (Signed)
[facility]    CSN: [id number] Arrival date & time: 10/21/21  0856      History   Chief Complaint No chief complaint on file.   HPI Kelly [name] is a 31 y.o. female.   Patient presents urgent care for evaluation of sore throat that started on Sunday morning October 19, 2021 (2 days ago).  Throat pain is currently a 6 on a scale of 0-10.  Patient had 1 episode of vomiting this morning due to the throat irritation and pain.  She denies nausea at this time.  No fever, but reports some chills and feeling hot and cold.  It hurts to swallow but she is not having any difficulty maintaining her secretions or her airway and denies shortness of breath/chest pain.  Reports a small amount of head pain associated with illness.  No blurry vision or decreased visual acuity/dizziness reported.  She states that her daughter has recently been sick with a fever at home but got better without going to see a provider.  She denies rash, urinary symptoms, eye drainage, cough, and nasal congestion.  She has attempted use of Tylenol at home for her throat pain with some relief.  Last dose of Tylenol was last night.  No known sick contacts other than her daughter.  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       Past Medical History:  Diagnosis Date   Hypertension    Medical history non-contributory     Patient Active Problem List   Diagnosis Date Noted   Postpartum state 03/11/2014   Pregnancy induced hypertension 03/10/2014    Past Surgical History:  Procedure Laterality Date   INDUCED ABORTION     NO PAST SURGERIES      OB History     Gravida  3   Para  1   Term  1   Preterm  0   AB  2   Living  1      SAB  1   IAB  1   Ectopic  0   Multiple      Live Births  1            Home Medications    Prior to Admission medications   Medication Sig Start Date End Date Taking? Authorizing Provider   acetaminophen (TYLENOL) 500 MG tablet Take 2 tablets (1,000 mg total) by mouth every 6 (six) hours as needed. 10/21/21  Yes Carlisle Beers, FNP  amoxicillin (AMOXIL) 500 MG capsule Take 1 capsule (500 mg total) by mouth 2 (two) times daily for 10 days. 10/21/21 10/31/21 Yes Makinna Andy, Donavan Burnet, FNP  ibuprofen (ADVIL) 600 MG tablet Take 1 tablet (600 mg total) by mouth every 6 (six) hours as needed. 10/21/21  Yes Carlisle Beers, FNP  amLODipine (NORVASC) 5 MG tablet Take 1 tablet (5 mg total) by mouth daily. 05/15/18 07/14/18  Arnaldo Natal, MD    Family History Family History  Problem Relation Age of Onset   Hypertension Mother    Diabetes Father    Cancer Paternal Grandmother        grandma   Hearing loss Neg Hx     Social History Social History   Tobacco Use   Smoking status: Never   Smokeless tobacco: Never  Vaping Use   Vaping Use: Never used  Substance Use Topics   Alcohol use: Never   Drug use:  Never     Allergies   Patient has no known allergies.   Review of Systems Review of Systems   Physical Exam Triage Vital Signs ED Triage Vitals  Enc Vitals Group     BP 10/21/21 1009 (!) 156/104     Pulse Rate 10/21/21 1009 78     Resp 10/21/21 1009 14     Temp 10/21/21 1009 98.3 F (36.8 C)     Temp Source 10/21/21 1009 Oral     SpO2 10/21/21 1009 100 %     Weight --      Height --      Head Circumference --      Peak Flow --      Pain Score 10/21/21 1013 6     Pain Loc --      Pain Edu? --      Excl. in GC? --    No data found.  Updated Vital Signs BP (!) 156/104 (BP Location: Left Arm)   Pulse 78   Temp 98.3 F (36.8 C) (Oral)   Resp 14   LMP  (LMP Unknown)   SpO2 100%   Breastfeeding No   Visual Acuity Right Eye Distance:   Left Eye Distance:   Bilateral Distance:    Right Eye Near:   Left Eye Near:    Bilateral Near:     Physical Exam Vitals and nursing note reviewed.  Constitutional:      Appearance: Normal appearance.  She is obese. She is ill-appearing. She is not toxic-appearing.     Comments: Very pleasant patient sitting on exam in position of comfort table in no acute distress.   HENT:     Head: Normocephalic and atraumatic.     Right Ear: Hearing and external ear normal.     Left Ear: Hearing and external ear normal.     Nose: Nose normal.     Mouth/Throat:     Lips: Pink.     Mouth: Mucous membranes are moist.     Pharynx: Oropharyngeal exudate and posterior oropharyngeal erythema present.  Eyes:     General: Lids are normal. Vision grossly intact. Gaze aligned appropriately.     Extraocular Movements: Extraocular movements intact.     Conjunctiva/sclera: Conjunctivae normal.  Pulmonary:     Effort: Pulmonary effort is normal.  Abdominal:     General: Bowel sounds are normal.     Palpations: Abdomen is soft.     Tenderness: There is no abdominal tenderness. There is no right CVA tenderness or left CVA tenderness.  Musculoskeletal:     Cervical back: Neck supple.  Lymphadenopathy:     Cervical: Cervical adenopathy present.  Skin:    General: Skin is warm and dry.     Capillary Refill: Capillary refill takes less than 2 seconds.     Findings: No rash.  Neurological:     General: No focal deficit present.     Mental Status: She is alert and oriented to person, place, and time. Mental status is at baseline.     Cranial Nerves: No dysarthria or facial asymmetry.     Gait: Gait is intact.  Psychiatric:        Mood and Affect: Mood normal.        Speech: Speech normal.        Behavior: Behavior normal.        Thought Content: Thought content normal.        Judgment: Judgment normal.  UC Treatments / Results  Labs (all labs ordered are listed, but only abnormal results are displayed) Labs Reviewed  POCT RAPID STREP A, ED / UC - Abnormal; Notable for the following components:      Result Value   Streptococcus, Group A Screen (Direct) POSITIVE (*)    All other components within  normal limits    EKG   Radiology No results found.  Procedures Procedures (including critical care time)  Medications Ordered in UC Medications  ibuprofen (ADVIL) tablet 800 mg (has no administration in time range)  ondansetron (ZOFRAN-ODT) disintegrating tablet 4 mg (4 mg Oral Given 10/21/21 1040)    Initial Impression / Assessment and Plan / UC Course  I have reviewed the triage vital signs and the nursing notes.  Pertinent labs & imaging results that were available during my care of the patient were reviewed by me and considered in my medical decision making (see chart for details).  1.  Strep pharyngitis Amoxicillin twice daily for the next 10 days prescribed.  Patient denies allergies to antibiotics.  She may use ibuprofen 600 mg and Tylenol 1000 mg at home every 6 hours as needed for throat pain and fever.  Patient given Zofran 4 mg in the clinic for nausea and vomiting.  Patient reports improved symptoms after Zofran use.  Given 800 mg of ibuprofen in clinic for sore throat.  She may have more ibuprofen at approximately 730 tonight with food if needed for throat pain at that time.  Patient to follow-up with urgent care or primary care in the next 2 to 3 days if she is not seeing improvement in her symptoms for reevaluation while on antibiotics.   Discussed physical exam and available lab work findings in clinic with patient.  Counseled patient regarding appropriate use of medications and potential side effects for all medications recommended or prescribed today. Discussed red flag signs and symptoms of worsening condition,when to call the PCP office, return to urgent care, and when to seek higher level of care in the emergency department. Patient verbalizes understanding and agreement with plan. All questions answered. Patient discharged in stable condition.  Final Clinical Impressions(s) / UC Diagnoses   Final diagnoses:  Strep pharyngitis     Discharge Instructions       Take amoxicillin twice daily for the next 10 days to treat your strep throat.  Continue to take ibuprofen 600 mg every 6 hours and Tylenol 1000 mg every 6 hours as needed for your sore throat pain and fever at home.  You were given 800 mg ibuprofen in the clinic so you may take more than 8 hours at 7:30 PM tonight with food if needed.   If you develop any new or worsening symptoms or do not improve in the next 2 to 3 days, please return.  If your symptoms are severe, please go to the emergency room.  Follow-up with your primary care provider for further evaluation and management of your symptoms as well as ongoing wellness visits.  I hope you feel better!     ED Prescriptions     Medication Sig Dispense Auth. Provider   amoxicillin (AMOXIL) 500 MG capsule Take 1 capsule (500 mg total) by mouth 2 (two) times daily for 10 days. 20 capsule Reita May M, FNP   ibuprofen (ADVIL) 600 MG tablet Take 1 tablet (600 mg total) by mouth every 6 (six) hours as needed. 30 tablet Carlisle Beers, FNP   acetaminophen (TYLENOL) 500 MG tablet Take  2 tablets (1,000 mg total) by mouth every 6 (six) hours as needed. 30 tablet Carlisle Beers, FNP      PDMP not reviewed this encounter.   Carlisle Beers, [place] 10/21/21 1129

## 2021-10-21 NOTE — ED Triage Notes (Signed)
Patient c/o sore throat and bilateral ear pain x 3 days.   Patient endorses chills, patient is unaware of temperature at home.   Patient endorses painful swallowing.   Patient endorses recent step exposure.   Patient has taken OTC medicines with no relief of symptoms.

## 2022-01-08 ENCOUNTER — Encounter (HOSPITAL_COMMUNITY): Payer: Self-pay | Admitting: *Deleted

## 2022-01-08 ENCOUNTER — Ambulatory Visit (HOSPITAL_COMMUNITY)
Admission: EM | Admit: 2022-01-08 | Discharge: 2022-01-08 | Disposition: A | Discharge status: Home / Self Care | Payer: Commercial Managed Care - HMO | Attending: Family Medicine | Admitting: Family Medicine

## 2022-01-08 ENCOUNTER — Ambulatory Visit (INDEPENDENT_AMBULATORY_CARE_PROVIDER_SITE_OTHER): Payer: Commercial Managed Care - HMO

## 2022-01-08 DIAGNOSIS — M549 Dorsalgia, unspecified: Secondary | ICD-10-CM | POA: Diagnosis not present

## 2022-01-08 DIAGNOSIS — J189 Pneumonia, unspecified organism: Secondary | ICD-10-CM | POA: Diagnosis not present

## 2022-01-08 DIAGNOSIS — R0602 Shortness of breath: Secondary | ICD-10-CM | POA: Diagnosis not present

## 2022-01-08 MED ORDER — DOXYCYCLINE HYCLATE 100 MG PO CAPS: 100.0000 mg | ORAL_CAPSULE | Freq: Two times a day (BID) | ORAL | 0 refills | Status: AC

## 2022-01-08 MED ORDER — NAPROXEN 500 MG PO TABS: 500.0000 mg | ORAL_TABLET | Freq: Two times a day (BID) | ORAL | 0 refills | Status: DC | PRN

## 2022-01-08 NOTE — ED Triage Notes (Signed)
Pt states that her right upper back hurts X 2 days. No known injury She states she is SOB and cant take a deep breath due to the pain. She was taking IBU for some relief but pain returns.

## 2022-01-08 NOTE — Discharge Instructions (Addendum)
There is an area on your x-ray that looks like pneumonia to the radiology doctors.  Take doxycycline 100 mg --1 capsule 2 times daily for 7 days  Take naproxen 500 mg--1 tablet every 12 hours as needed for pain  You need a follow-up x-ray in 3 to 4 weeks.  We need to make sure that this is clearing up with antibiotics.  If you are finding it difficult to get in to a primary care practice in a timely manner to get this x-ray done, then please come back here to this urgent care  You can use the QR code/website at the back of the summary paperwork to schedule yourself a new patient appointment with primary care

## 2022-01-08 NOTE — ED Provider Notes (Signed)
[facility]    CSN: [id number] Arrival date & time: 01/08/22  1019      History   Chief Complaint Chief Complaint  Patient presents with   Back Pain    HPI Kelly [name] is a 31 y.o. female.    Back Pain  Here for right upper back pain, at about her shoulder blade. It hurts worse with deep breath and with movement of her right arm.  No fever or chills.  No cough or congestion.  She has felt tired since this started.  Symptoms began 2 days ago  Last menstrual cycle was September 28  Past Medical History:  Diagnosis Date   Hypertension    Medical history non-contributory     Patient Active Problem List   Diagnosis Date Noted   Postpartum state 03/11/2014   Pregnancy induced hypertension 03/10/2014    Past Surgical History:  Procedure Laterality Date   INDUCED ABORTION     NO PAST SURGERIES      OB History     Gravida  3   Para  1   Term  1   Preterm  0   AB  2   Living  1      SAB  1   IAB  1   Ectopic  0   Multiple      Live Births  1            Home Medications    Prior to Admission medications   Medication Sig Start Date End Date Taking? Authorizing Provider  doxycycline (VIBRAMYCIN) 100 MG capsule Take 1 capsule (100 mg total) by mouth 2 (two) times daily for 7 days. 01/08/22 01/15/22 Yes Barrett Henle, MD  naproxen (NAPROSYN) 500 MG tablet Take 1 tablet (500 mg total) by mouth 2 (two) times daily as needed (pain). 01/08/22  Yes Barrett Henle, MD  acetaminophen (TYLENOL) 500 MG tablet Take 2 tablets (1,000 mg total) by mouth every 6 (six) hours as needed. 10/21/21   Talbot Grumbling, FNP  amLODipine (NORVASC) 5 MG tablet Take 1 tablet (5 mg total) by mouth daily. 05/15/18 07/14/18  Harrie Foreman, MD    Family History Family History  Problem Relation Age of Onset   Hypertension Mother    Diabetes Father    Cancer Paternal Grandmother        grandma   Hearing loss Neg Hx      Social History Social History   Tobacco Use   Smoking status: Never   Smokeless tobacco: Never  Vaping Use   Vaping Use: Never used  Substance Use Topics   Alcohol use: Never   Drug use: Never     Allergies   Patient has no known allergies.   Review of Systems Review of Systems  Musculoskeletal:  Positive for back pain.     Physical Exam Triage Vital Signs ED Triage Vitals  Enc Vitals Group     BP 01/08/22 1206 120/63     Pulse Rate 01/08/22 1206 94     Resp 01/08/22 1206 18     Temp 01/08/22 1206 98 F (36.7 C)     Temp Source 01/08/22 1206 Oral     SpO2 01/08/22 1206 99 %     Weight --      Height --      Head Circumference --      Peak Flow --      Pain Score 01/08/22 1205 8  Pain Loc --      Pain Edu? --      Excl. in [place]? --    No data found.  Updated Vital Signs BP 120/63 (BP Location: Left Arm)   Pulse 94   Temp 98 F (36.7 C) (Oral)   Resp 18   LMP 01/01/2022 (Approximate)   SpO2 99%   Visual Acuity Right Eye Distance:   Left Eye Distance:   Bilateral Distance:    Right Eye Near:   Left Eye Near:    Bilateral Near:     Physical Exam Vitals reviewed.  Constitutional:      General: She is not in acute distress.    Appearance: She is not ill-appearing, toxic-appearing or diaphoretic.  HENT:     Nose: Nose normal.     Mouth/Throat:     Mouth: Mucous membranes are moist.     Pharynx: No oropharyngeal exudate or posterior oropharyngeal erythema.  Eyes:     Extraocular Movements: Extraocular movements intact.     Conjunctiva/sclera: Conjunctivae normal.     Pupils: Pupils are equal, round, and reactive to light.  Cardiovascular:     Rate and Rhythm: Normal rate and regular rhythm.     Heart sounds: No murmur heard. Pulmonary:     Effort: Pulmonary effort is normal. No respiratory distress.     Breath sounds: No stridor. No wheezing, rhonchi or rales.     Comments: Right posterior chest/upper back is not tender to digital  palpation.  There is no rash Musculoskeletal:     Cervical back: Neck supple.  Lymphadenopathy:     Cervical: No cervical adenopathy.  Skin:    Capillary Refill: Capillary refill takes less than 2 seconds.     Coloration: Skin is not jaundiced or pale.  Neurological:     General: No focal deficit present.     Mental Status: She is alert and oriented to person, place, and time.  Psychiatric:        Behavior: Behavior normal.      UC Treatments / Results  Labs (all labs ordered are listed, but only abnormal results are displayed) Labs Reviewed - No data to display  EKG   Radiology DG Chest 2 View  Result Date: 01/08/2022 CLINICAL DATA:  Shortness of breath. EXAM: CHEST - 2 VIEW COMPARISON:  February 06, 2017. FINDINGS: The heart size and mediastinal contours are within normal limits. Right middle lobe airspace opacity is noted concerning for pneumonia. Left lung is clear. The visualized skeletal structures are unremarkable. IMPRESSION: Right middle lobe airspace opacity consistent with pneumonia. Followup PA and lateral chest X-ray is recommended in 3-4 weeks following trial of antibiotic therapy to ensure resolution and exclude underlying malignancy. Electronically Signed   By: Marijo Conception M.D.   On: 01/08/2022 12:55    Procedures Procedures (including critical care time)  Medications Ordered in UC Medications - No data to display  Initial Impression / Assessment and Plan / UC Course  I have reviewed the triage vital signs and the nursing notes.  Pertinent labs & imaging results that were available during my care of the patient were reviewed by me and considered in my medical decision making (see chart for details).        Her chest x-ray shows an opacity in the right middle lobe that radiology feels is consistent with pneumonia.  They do recommend follow-up x-rays in 3 to 4 weeks to make sure it is cleared after antibiotic therapy.  The  patient I discussed how she  does not have the typical symptoms of pneumonia with no cough and no congestion.  She was incredulous that pneumonia might be the diagnosis.  I have shown her what her images look like and the radiology report.  I also discussed with her that this is the reason that we want to redo her images in a few weeks.  She does not have primary care at this time.  Assistance in finding a primary care is requested, but also we have recommended she use the website at the back of our summary paperwork to schedule with primary care.  If she cannot work that out with anyone, I want her to come back here for follow-up x-rays Final Clinical Impressions(s) / UC Diagnoses   Final diagnoses:  Upper back pain on right side  Pneumonia of right middle lobe due to infectious organism     Discharge Instructions      There is an area on your x-ray that looks like pneumonia to the radiology doctors.  Take doxycycline 100 mg --1 capsule 2 times daily for 7 days  Take naproxen 500 mg--1 tablet every 12 hours as needed for pain  You need a follow-up x-ray in 3 to 4 weeks.  We need to make sure that this is clearing up with antibiotics.  If you are finding it difficult to get in to a primary care practice in a timely manner to get this x-ray done, then please come back here to this urgent care  You can use the QR code/website at the back of the summary paperwork to schedule yourself a new patient appointment with primary care         ED Prescriptions     Medication Sig Dispense Auth. Provider   doxycycline (VIBRAMYCIN) 100 MG capsule Take 1 capsule (100 mg total) by mouth 2 (two) times daily for 7 days. 14 capsule Barrett Henle, MD   naproxen (NAPROSYN) 500 MG tablet Take 1 tablet (500 mg total) by mouth 2 (two) times daily as needed (pain). 30 tablet Mckinley Adelstein, Gwenlyn Perking, MD      PDMP not reviewed this encounter.   Barrett Henle, MD 01/08/22 [phone]

## 2022-01-09 ENCOUNTER — Encounter (HOSPITAL_BASED_OUTPATIENT_CLINIC_OR_DEPARTMENT_OTHER): Payer: Self-pay | Admitting: Emergency Medicine

## 2022-03-09 ENCOUNTER — Ambulatory Visit (INDEPENDENT_AMBULATORY_CARE_PROVIDER_SITE_OTHER): Payer: Commercial Managed Care - HMO | Admitting: Family

## 2022-03-09 ENCOUNTER — Encounter: Payer: Self-pay | Admitting: Family

## 2022-03-09 VITALS — BP 136/80 | HR 81 | Temp 98.0°F | Resp 17 | Ht 60.0 in | Wt 226.2 lb

## 2022-03-09 DIAGNOSIS — Z7689 Persons encountering health services in other specified circumstances: Secondary | ICD-10-CM

## 2022-03-09 DIAGNOSIS — Z6841 Body Mass Index (BMI) 40.0 and over, adult: Secondary | ICD-10-CM

## 2022-03-09 DIAGNOSIS — Z23 Encounter for immunization: Secondary | ICD-10-CM | POA: Diagnosis not present

## 2022-03-09 DIAGNOSIS — G43009 Migraine without aura, not intractable, without status migrainosus: Secondary | ICD-10-CM

## 2022-03-09 DIAGNOSIS — R5383 Other fatigue: Secondary | ICD-10-CM | POA: Diagnosis not present

## 2022-03-09 DIAGNOSIS — I10 Essential (primary) hypertension: Secondary | ICD-10-CM | POA: Insufficient documentation

## 2022-03-09 DIAGNOSIS — Z1159 Encounter for screening for other viral diseases: Secondary | ICD-10-CM | POA: Diagnosis not present

## 2022-03-09 DIAGNOSIS — M545 Low back pain, unspecified: Secondary | ICD-10-CM

## 2022-03-09 DIAGNOSIS — G8929 Other chronic pain: Secondary | ICD-10-CM

## 2022-03-09 MED ORDER — TETANUS-DIPHTH-ACELL PERTUSSIS 5-2.5-18.5 LF-MCG/0.5 IM SUSP: 0.5000 mL | Freq: Once | INTRAMUSCULAR | 0 refills | Status: AC

## 2022-03-09 MED ORDER — LOSARTAN POTASSIUM 25 MG PO TABS: 25.0000 mg | ORAL_TABLET | Freq: Every morning | ORAL | 1 refills | Status: AC

## 2022-03-09 MED ORDER — SUMATRIPTAN SUCCINATE 25 MG PO TABS: 25.0000 mg | ORAL_TABLET | ORAL | 0 refills | Status: AC | PRN

## 2022-03-09 NOTE — Progress Notes (Signed)
Provider: Marlowe Sax FNP-C   Teague Goynes, Nelda Bucks, NP  Patient Care Team: Adaisha Campise, Nelda Bucks, NP as PCP - General (Family Medicine) Patient, No Pcp Per (General Practice)  Extended Emergency Contact Information Primary Emergency Contact: Delos Haring [place] Phone: [phone] Mobile Phone: [phone] Relation: Mother  Code Status:  Full Code Goals of care: Advanced Directive information    03/09/2022   10:09 AM  Advanced Directives  Does Patient Have a Medical Advance Directive? No  Would patient like information on creating a medical advance directive? No - Patient declined     Chief Complaint  Patient presents with   Establish Care    New Patient.     HPI:  Pt is a 31 y.o. female seen today establish care here at [facility] for medical management of chronic diseases.Has some medical history of hypertension, migraines without aura, obesity among others. States has not had an insurance for several years so has not been able to see a primary care provider.  Has also been out of blood pressure medication.  Hypertension -no home blood pressure readings for review.  Has been on losartan 25 mg daily.also looks like he used to take amlodipine 5 mg daily but according to chart amlodipine is discontinued. She denies any headache,dizziness,vision changes,fatigue,chest tightness,palpitation,chest pain or shortness of breath.    Migraine -states gets a migraine almost twice a month which usually lasts about 3 days.  Initially feels tired and sometimes has nausea but no vomiting.  Takes Advil whenever she has a migraine but ineffective.  Weight gain  -states he used to weigh 173 pounds few years ago.  She used to go to the gym about 3 times per week and drink protein supplements. She was also on for them in but stopped and 2021 since she did not have any insurance. She was also not able to afford going to the gym 3 times so has been walking.  She requests another medication to help her with weight loss. Does intermittent fasting but sometimes gets too busy and forgets to eat. She tends to skip breakfast but ate a big meal in the evening.she does not drink soda has occasional.  Discussed with her dietary modification and exercise at least 3 times per week for 30 minutes. Emphasized on lifestyle modification. Also recommended referral to weight management since she was on oral phentamine and regained more weight upon stopping.   She complains of constant chronic lower back pain which she attributes with obesity state if she can just lose weight to improve low back pain. She denies any numbness, tingling or weakness in lower extremities.  Health Maintenance:  She is due for tetanus, influenza and COVID-19 vaccine. However she states does not usually take flu shot thinks makes people sick when administered.  Also not planning to get any COVID-19 vaccine. Have discussed importance of tetanus vaccine encouraged her to get vaccine at the pharmacy.  States previously had IUD that was removed.  Has not been sexually active over 1 year.  However states not heavily bleeding but breast feels tender. She is due for cervical cancer screening with Pap smear.  She will schedule appointment with a gynecologist for follow-up with Pap smear.   Past Medical History:  Diagnosis Date   Hypertension    Medical history non-contributory    Past Surgical History:  Procedure Laterality Date   INDUCED ABORTION     NO PAST SURGERIES  No Known Allergies  Allergies as of 03/09/2022   No Known Allergies      Medication List        Accurate as of March 09, 2022 10:19 AM. If you have any questions, ask your nurse or doctor.          STOP taking these medications    amLODipine 5 MG tablet Commonly known as: NORVASC Stopped by: Nelda Bucks Rogena Deupree, NP   naproxen 500 MG tablet Commonly known as: NAPROSYN Stopped by: Sandrea Hughs, NP        TAKE these medications    acetaminophen 500 MG tablet Commonly known as: TYLENOL Take 2 tablets (1,000 mg total) by mouth every 6 (six) hours as needed.   losartan 25 MG tablet Commonly known as: COZAAR Take 25 mg by mouth every morning.        Review of Systems  Constitutional:  Positive for fatigue. Negative for appetite change, chills, fever and unexpected weight change.  HENT:  Negative for congestion, dental problem, ear discharge, ear pain, facial swelling, hearing loss, nosebleeds, postnasal drip, rhinorrhea, sinus pressure, sinus pain, sneezing, sore throat, tinnitus and trouble swallowing.   Eyes:  Negative for pain, discharge, redness, itching and visual disturbance.  Respiratory:  Negative for cough, chest tightness, shortness of breath and wheezing.   Cardiovascular:  Negative for chest pain, palpitations and leg swelling.  Gastrointestinal:  Negative for abdominal distention, abdominal pain, blood in stool, constipation, diarrhea, nausea and vomiting.  Endocrine: Negative for cold intolerance, heat intolerance, polydipsia, polyphagia and polyuria.  Genitourinary:  Negative for difficulty urinating, dysuria, flank pain, frequency and urgency.  Musculoskeletal:  Negative for arthralgias, back pain, gait problem, joint swelling, myalgias, neck pain and neck stiffness.  Skin:  Negative for color change, pallor, rash and wound.  Neurological:  Positive for headaches. Negative for dizziness, syncope, speech difficulty, weakness, light-headedness and numbness.       Migraine headache   Hematological:  Does not bruise/bleed easily.  Psychiatric/Behavioral:  Negative for agitation, behavioral problems, confusion, hallucinations, self-injury, sleep disturbance and suicidal ideas. The patient is not nervous/anxious.     There is no immunization history for the selected administration types on file for this patient. Pertinent  Health Maintenance Due  Topic Date Due   PAP  SMEAR-Modifier  06/07/2020   INFLUENZA VACCINE  Never done      05/15/2018    1:40 PM 10/21/2021   10:15 AM 03/09/2022   10:09 AM  Fall Risk  Falls in the past year?   0  Was there an injury with Fall?   0  Fall Risk Category Calculator   0  Fall Risk Category   Low  Patient Fall Risk Level Low fall risk Low fall risk Low fall risk  Patient at Risk for Falls Due to   No Fall Risks  Fall risk Follow up   Falls evaluation completed   Functional Status Survey:    Vitals:   03/09/22 1006 03/09/22 1013  BP: (!) 140/80 136/80  Pulse: 81   Resp: 17   Temp: 98 F (36.7 C)   SpO2: 98%   Weight: 226 lb 3.2 oz (102.6 kg)   Height: 5' (1.524 m)    Body mass index is 44.18 kg/m. Physical Exam Vitals reviewed.  Constitutional:      General: She is not in acute distress.    Appearance: Normal appearance. She is morbidly obese. She is not ill-appearing or diaphoretic.  HENT:  Head: Normocephalic.     Right Ear: Tympanic membrane, ear canal and external ear normal. There is no impacted cerumen.     Left Ear: Tympanic membrane, ear canal and external ear normal. There is no impacted cerumen.     Nose: Nose normal. No congestion or rhinorrhea.     Mouth/Throat:     Mouth: Mucous membranes are moist.     Pharynx: Oropharynx is clear. No oropharyngeal exudate or posterior oropharyngeal erythema.  Eyes:     General: No scleral icterus.       Right eye: No discharge.        Left eye: No discharge.     Extraocular Movements: Extraocular movements intact.     Conjunctiva/sclera: Conjunctivae normal.     Pupils: Pupils are equal, round, and reactive to light.  Neck:     Vascular: No carotid bruit.  Cardiovascular:     Rate and Rhythm: Normal rate and regular rhythm.     Pulses: Normal pulses.     Heart sounds: Normal heart sounds. No murmur heard.    No friction rub. No gallop.  Pulmonary:     Effort: Pulmonary effort is normal. No respiratory distress.     Breath sounds: Normal  breath sounds. No wheezing, rhonchi or rales.  Chest:     Chest wall: No tenderness.  Abdominal:     General: Bowel sounds are normal. There is no distension.     Palpations: Abdomen is soft. There is no mass.     Tenderness: There is no abdominal tenderness. There is no right CVA tenderness, left CVA tenderness, guarding or rebound.  Musculoskeletal:        General: No swelling or tenderness. Normal range of motion.     Cervical back: Normal range of motion. No rigidity or tenderness.     Right lower leg: No edema.     Left lower leg: No edema.  Lymphadenopathy:     Cervical: No cervical adenopathy.  Skin:    General: Skin is warm and dry.     Coloration: Skin is not pale.     Findings: No bruising, erythema, lesion or rash.  Neurological:     Mental Status: She is alert and oriented to person, place, and time.     Cranial Nerves: No cranial nerve deficit.     Sensory: No sensory deficit.     Motor: No weakness.     Coordination: Coordination normal.     Gait: Gait normal.  Psychiatric:        Mood and Affect: Mood normal.        Speech: Speech normal.        Behavior: Behavior normal.        Thought Content: Thought content normal.        Judgment: Judgment normal.     Labs reviewed: No results for input(s): "NA", "K", "CL", "CO2", "GLUCOSE", "BUN", "CREATININE", "CALCIUM", "MG", "PHOS" in the last 8760 hours. No results for input(s): "AST", "ALT", "ALKPHOS", "BILITOT", "PROT", "ALBUMIN" in the last 8760 hours. No results for input(s): "WBC", "NEUTROABS", "HGB", "HCT", "MCV", "PLT" in the last 8760 hours. No results found for: "TSH" No results found for: "HGBA1C" No results found for: "CHOL", "HDL", "LDLCALC", "LDLDIRECT", "TRIG", "CHOLHDL"  Significant Diagnostic Results in last 30 days:  No results found.  Assessment/Plan 1. Encounter to establish care Available medical records reviewed, due for tetanus, influenza and COVID vaccine but declines influenza and  COVID-vaccine.  Discussed importance of tetanus vaccine  will receive vaccine at the pharmacy.  Also fasting today for fasting lab work.  2. Encounter for hepatitis C screening test for low risk patient Low risk - Hep C Antibody  3. Fatigue, unspecified type Unclear etiology will obtain lab work to rule out acute and metabolic causes. - TSH - COMPLETE METABOLIC PANEL WITH GFR - CBC with Differential/Platelet  4. Body mass index (BMI) of 40.1-44.9 in adult (HCC) BMI 44.18 associated with comorbidities essential hypertension. -Dietary modification and exercise at least 3 times per week for 30 minutes discussed at length.Has taken oral phentamine in the past but regained weight upon stopping medication.Recommended referral to weight management and emphasize lifestyle modification. - Amb Ref to Medical Weight Management  5. Need for Tdap vaccination Advised to get Tdap vaccine at the pharmacy. Script send to pharmacy today - Tdap ([place]) 5-2.5-18.5 LF-MCG/0.5 injection; Inject 0.5 mLs into the muscle once for 1 dose.  Dispense: 0.5 mL; Refill: 0  6. Migraine without aura and without status migrainosus, not intractable Intermittent unpleased twice per month.  Advil has been ineffective.  Will prescribe Imitrex side effects discussed. - SUMAtriptan (IMITREX) 25 MG tablet; Take 1 tablet (25 mg total) by mouth every 2 (two) hours as needed for migraine. May repeat in 2 hours if headache persists or recurs.  Dispense: 10 tablet; Refill: 0  7. Essential hypertension Blood pressure elevated has been out of her blood pressure medication due to lack of insurance. Will restart losartan 25 mg daily -Dietary modification and exercise advised as above -Advised to follow-up in 2 weeks for blood pressure recheck - TSH - COMPLETE METABOLIC PANEL WITH GFR - CBC with Differential/Platelet  8. Chronic bilateral low back pain without sciatica Chronic attributes to weight gain Exercise at least 3 times  per week for 30 minutes discussed -Continue with over-the-counter analgesic.  Family/ staff Communication: Reviewed plan of care with patient verbalized understanding  Labs/tests ordered:  - CBC with Differential/Platelet - CMP with eGFR([facility]) - TSH - Hep C Antibody  Next Appointment : Return in about 2 weeks (around 03/23/2022) for High blood pressure .   Sandrea Hughs, NP

## 2022-03-09 NOTE — Patient Instructions (Signed)
Please contact your local pharmacy, previous provider, or insurance carrier for vaccine/immunization records. Ensure that any procedures done outside of [facility] are faxed to us [phone] or you can sign release of records form at the front desk to keep your medical record updated.   ?

## 2022-03-10 LAB — COMPLETE METABOLIC PANEL WITHOUT GFR
AG Ratio: 1.2 (calc) (ref 1.0–2.5)
ALT: 13 U/L (ref 6–29)
AST: 17 U/L (ref 10–30)
Albumin: 4.3 g/dL (ref 3.6–5.1)
Alkaline phosphatase (APISO): 83 U/L (ref 31–125)
BUN: 9 mg/dL (ref 7–25)
CO2: 27 mmol/L (ref 20–32)
Calcium: 9.4 mg/dL (ref 8.6–10.2)
Chloride: 102 mmol/L (ref 98–110)
Creat: 0.91 mg/dL (ref 0.50–0.97)
Globulin: 3.7 g/dL (ref 1.9–3.7)
Glucose, Bld: 92 mg/dL (ref 65–99)
Potassium: 3.9 mmol/L (ref 3.5–5.3)
Sodium: 136 mmol/L (ref 135–146)
Total Bilirubin: 0.4 mg/dL (ref 0.2–1.2)
Total Protein: 8 g/dL (ref 6.1–8.1)
eGFR: 85 mL/min/1.73m2

## 2022-03-10 LAB — CBC WITH DIFFERENTIAL/PLATELET
Absolute Monocytes: 362 {cells}/uL (ref 200–950)
Basophils Absolute: 42 {cells}/uL (ref 0–200)
Basophils Relative: 0.9 %
Eosinophils Absolute: 99 {cells}/uL (ref 15–500)
Eosinophils Relative: 2.1 %
HCT: 41.5 % (ref 35.0–45.0)
Hemoglobin: 14.2 g/dL (ref 11.7–15.5)
Lymphs Abs: 2200 {cells}/uL (ref 850–3900)
MCH: 29.2 pg (ref 27.0–33.0)
MCHC: 34.2 g/dL (ref 32.0–36.0)
MCV: 85.2 fL (ref 80.0–100.0)
MPV: 10 fL (ref 7.5–12.5)
Monocytes Relative: 7.7 %
Neutro Abs: 1998 {cells}/uL (ref 1500–7800)
Neutrophils Relative %: 42.5 %
Platelets: 263 Thousand/uL (ref 140–400)
RBC: 4.87 Million/uL (ref 3.80–5.10)
RDW: 13.1 % (ref 11.0–15.0)
Total Lymphocyte: 46.8 %
WBC: 4.7 Thousand/uL (ref 3.8–10.8)

## 2022-03-10 LAB — TSH: TSH: 1.34 mIU/L

## 2022-03-10 LAB — HEPATITIS C ANTIBODY: Hepatitis C Ab: NONREACTIVE

## 2022-03-23 ENCOUNTER — Encounter: Payer: Self-pay | Admitting: Family

## 2022-03-23 NOTE — Progress Notes (Signed)
  This encounter was created in error - please disregard. No show 

## 2022-04-21 ENCOUNTER — Encounter (INDEPENDENT_AMBULATORY_CARE_PROVIDER_SITE_OTHER): Payer: Self-pay

## 2022-05-06 ENCOUNTER — Encounter (INDEPENDENT_AMBULATORY_CARE_PROVIDER_SITE_OTHER): Payer: Commercial Managed Care - HMO | Admitting: Family Medicine

## 2023-11-02 NOTE — Procedures (Signed)
 SABRA
